# Patient Record
Sex: Female | Born: 1970 | Race: White | Hispanic: No | Marital: Married | Smoking: Never smoker
Health system: Southern US, Community
[De-identification: ages and names within clinical notes are randomized; demographics above are authoritative.]

## PROBLEM LIST (undated history)

## (undated) DIAGNOSIS — I10 Essential (primary) hypertension: Secondary | ICD-10-CM

## (undated) DIAGNOSIS — T7840XA Allergy, unspecified, initial encounter: Secondary | ICD-10-CM

## (undated) DIAGNOSIS — M797 Fibromyalgia: Secondary | ICD-10-CM

## (undated) DIAGNOSIS — F32A Depression, unspecified: Secondary | ICD-10-CM

## (undated) DIAGNOSIS — F419 Anxiety disorder, unspecified: Secondary | ICD-10-CM

## (undated) DIAGNOSIS — M1990 Unspecified osteoarthritis, unspecified site: Secondary | ICD-10-CM

## (undated) DIAGNOSIS — F329 Major depressive disorder, single episode, unspecified: Secondary | ICD-10-CM

## (undated) HISTORY — PX: JOINT REPLACEMENT: SHX530

## (undated) HISTORY — DX: Depression, unspecified: F32.A

## (undated) HISTORY — PX: KNEE SURGERY: SHX244

## (undated) HISTORY — DX: Unspecified osteoarthritis, unspecified site: M19.90

## (undated) HISTORY — DX: Allergy, unspecified, initial encounter: T78.40XA

## (undated) HISTORY — PX: TUBAL LIGATION: SHX77

## (undated) HISTORY — DX: Essential (primary) hypertension: I10

## (undated) HISTORY — DX: Major depressive disorder, single episode, unspecified: F32.9

## (undated) HISTORY — DX: Anxiety disorder, unspecified: F41.9

## (undated) HISTORY — DX: Fibromyalgia: M79.7

---

## 2004-11-14 HISTORY — PX: TUBAL LIGATION: SHX77

## 2013-12-11 ENCOUNTER — Encounter: Payer: Self-pay | Admitting: Adult Health

## 2013-12-11 ENCOUNTER — Ambulatory Visit (INDEPENDENT_AMBULATORY_CARE_PROVIDER_SITE_OTHER): Payer: BC Managed Care – PPO | Admitting: Adult Health

## 2013-12-11 ENCOUNTER — Encounter (INDEPENDENT_AMBULATORY_CARE_PROVIDER_SITE_OTHER): Payer: Self-pay

## 2013-12-11 VITALS — BP 122/76 | HR 58 | Temp 98.0°F | Resp 12 | Ht 66.5 in | Wt 221.0 lb

## 2013-12-11 DIAGNOSIS — M797 Fibromyalgia: Secondary | ICD-10-CM | POA: Insufficient documentation

## 2013-12-11 DIAGNOSIS — R5383 Other fatigue: Secondary | ICD-10-CM | POA: Insufficient documentation

## 2013-12-11 DIAGNOSIS — IMO0001 Reserved for inherently not codable concepts without codable children: Secondary | ICD-10-CM

## 2013-12-11 DIAGNOSIS — R5381 Other malaise: Secondary | ICD-10-CM

## 2013-12-11 DIAGNOSIS — Z79899 Other long term (current) drug therapy: Secondary | ICD-10-CM

## 2013-12-11 DIAGNOSIS — G2581 Restless legs syndrome: Secondary | ICD-10-CM

## 2013-12-11 MED ORDER — PREGABALIN 75 MG PO CAPS: 75.0000 mg | ORAL_CAPSULE | Freq: Two times a day (BID) | ORAL | Status: DC

## 2013-12-11 NOTE — Assessment & Plan Note (Signed)
Check labs: TSH, cbc, iron, ferritin

## 2013-12-11 NOTE — Assessment & Plan Note (Signed)
Check B12, iron, ferritin.

## 2013-12-11 NOTE — Progress Notes (Signed)
Subjective:    Patient ID: Patty Kennedy, female    DOB: 1971/04/17, [age] y.o.   MRN: [id number]  HPI  Patty Kennedy is a pleasant 43 y/o female who presents to establish care. She was previously followed by Dr. Cyndia [facility]iverMcFadden in [facility]. She has seen him within the last year and reports having blood work. Will request records. She reports history of fibromyalgia diagnosed in 2005. She was on multiple medications but felt like "a zombie". She gradually stopped taking all of the medications. She reports having a flareup in December of her symptoms. She has been taking 800 mg of ibuprofen on a daily basis just to be able to sleep. She has also been having symptoms of restless legs. She reports having a history of low iron. Previously on replacement. She is feeling fatigue and not her usual self.    Past Medical History  Diagnosis Date  . Arthritis   . Depression   . Fibromyalgia      Past Surgical History  Procedure Laterality Date  . Tubal ligation  2006  . Knee surgery Bilateral 1999, 2000    arthroscopy     Family History  Problem Relation Age of Onset  . Arthritis Mother     rheumatoid  . Depression Mother   . Cancer Father [id number]    bone cancer  . Arthritis Paternal Grandmother   . Hypertension Sister   . Alcohol abuse Sister      History   Social History  . Marital Status: Married    Spouse Name: Onalee HuaDavid    Number of Children: 3  . Years of Education: 12   Occupational History  . Operations Manager     [employer]   Social History Main Topics  . Smoking status: Never Smoker   . Smokeless tobacco: Never Used  . Alcohol Use: Yes     Comment: Rare 1 every 6 month  . Drug Use: No  . Sexual Activity: Not on file   Other Topics Concern  . Not on file   Social History Narrative   Patty [place] She lives at home with her husband, Onalee HuaDavid, in [place]. 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 She enjoys people. Patty Kennedy enjoys gardening and the outdoors.  She also enjoys swimming. She also enjoys reading.    Review of Systems  Constitutional: Positive for fatigue.  HENT: Negative.   Eyes: Negative.   Respiratory: Negative.   Cardiovascular: Negative.   Gastrointestinal: Negative.   Endocrine: Negative.   Genitourinary: Negative.   Musculoskeletal: Positive for arthralgias and myalgias.       Fibromyalgia flare up since December  Skin: Negative.   Allergic/Immunologic: Negative.   Neurological: Negative.   Hematological: Negative.   Psychiatric/Behavioral: Negative.        Objective:   Physical Exam  Constitutional: She is oriented to person, place, and time. No distress.  Very pleasant 43 y/o female  HENT:  Head: Normocephalic and atraumatic.  Eyes: Conjunctivae and EOM are normal. Pupils are equal, round, and reactive to light.  Neck: Normal range of motion. Neck supple. No tracheal deviation present.  Cardiovascular: Normal rate, regular rhythm, normal heart sounds and intact distal pulses.  Exam reveals no gallop and no friction rub.   No murmur heard. Pulmonary/Chest: Effort normal and breath sounds normal. No respiratory distress. She has no wheezes. She has no rales.  Musculoskeletal: Normal range of motion. She exhibits no edema and no tenderness.  Neurological: She  is alert and oriented to person, place, and time. She has normal reflexes. Coordination normal.  Skin: Skin is warm and dry.  Psychiatric: She has a normal mood and affect. Her behavior is normal. Judgment and thought content normal.          Assessment & Plan:

## 2013-12-11 NOTE — Progress Notes (Signed)
Pre visit review using our clinic review tool, if applicable. No additional management support is needed unless otherwise documented below in the visit note. 

## 2013-12-11 NOTE — Assessment & Plan Note (Signed)
Diagnosed in 2005. She had been doing fine until December when she reports having a flare up of her symptoms. Was on multiple medications but did not like to take so many meds. Now taking ibuprofen 800 mg daily. Check bmet, cbc. Start Lyrica 75 mg bid. Follow up in 1 month.

## 2013-12-11 NOTE — Patient Instructions (Signed)
   Thank you for choosing [facility] for your health care needs.  Please have your labs drawn prior to leaving the office.  I will call you with the results once they are available.  Start Lyrica 75 mg twice a day. Return for follow up visit in 1 month.l

## 2013-12-12 ENCOUNTER — Other Ambulatory Visit: Payer: Self-pay | Admitting: Adult Health

## 2013-12-12 DIAGNOSIS — E538 Deficiency of other specified B group vitamins: Secondary | ICD-10-CM

## 2013-12-12 LAB — BASIC METABOLIC PANEL
BUN: 16 mg/dL (ref 6–23)
CHLORIDE: 103 meq/L (ref 96–112)
CO2: 25 mEq/L (ref 19–32)
Calcium: 9.4 mg/dL (ref 8.4–10.5)
Creatinine, Ser: 0.8 mg/dL (ref 0.4–1.2)
GFR: 86.27 mL/min (ref >60.00)
GLUCOSE: 79 mg/dL (ref 70–99)
POTASSIUM: 4.4 meq/L (ref 3.5–5.1)
SODIUM: 138 meq/L (ref 135–145)

## 2013-12-12 LAB — TSH: TSH: 0.87 u[IU]/mL (ref 0.35–5.50)

## 2013-12-12 LAB — IRON: Iron: 60 ug/dL (ref 42–145)

## 2013-12-12 LAB — FERRITIN: Ferritin: 30.8 ng/mL (ref 10.0–291.0)

## 2013-12-12 LAB — VITAMIN B12: VITAMIN B 12: 295 pg/mL (ref 211–911)

## 2014-01-01 ENCOUNTER — Encounter: Payer: Self-pay | Admitting: Internal Medicine

## 2014-01-01 ENCOUNTER — Ambulatory Visit (INDEPENDENT_AMBULATORY_CARE_PROVIDER_SITE_OTHER): Payer: BC Managed Care – PPO | Admitting: Internal Medicine

## 2014-01-01 VITALS — BP 142/90 | HR 60 | Temp 99.0°F | Resp 18 | Wt 222.5 lb

## 2014-01-01 DIAGNOSIS — J029 Acute pharyngitis, unspecified: Secondary | ICD-10-CM

## 2014-01-01 DIAGNOSIS — R6889 Other general symptoms and signs: Secondary | ICD-10-CM

## 2014-01-01 LAB — POCT INFLUENZA A/B
Influenza A, POC: NEGATIVE
Influenza B, POC: NEGATIVE

## 2014-01-01 LAB — POCT RAPID STREP A (OFFICE): Rapid Strep A Screen: NEGATIVE

## 2014-01-01 MED ORDER — HYDROCOD POLST-CHLORPHEN POLST 10-8 MG/5ML PO LQCR: 10.0000 mL | Freq: Every evening | ORAL | Status: DC | PRN

## 2014-01-01 MED ORDER — PROMETHAZINE HCL 12.5 MG PO TABS: 12.5000 mg | ORAL_TABLET | Freq: Three times a day (TID) | ORAL | Status: DC | PRN

## 2014-01-01 MED ORDER — AMOXICILLIN-POT CLAVULANATE 875-125 MG PO TABS: 1.0000 | ORAL_TABLET | Freq: Two times a day (BID) | ORAL | Status: DC

## 2014-01-01 MED ORDER — ESCITALOPRAM OXALATE 20 MG PO TABS: 20.0000 mg | ORAL_TABLET | Freq: Every day | ORAL | Status: DC

## 2014-01-01 NOTE — Progress Notes (Signed)
Pre-visit discussion using our clinic review tool. No additional management support is needed unless otherwise documented below in the visit note.  

## 2014-01-01 NOTE — Patient Instructions (Signed)
You appear to be suffering from a viral  Syndrome . The reapid strep and flu tests were negative, and the throat culture is pending   The post nasal drip may be  causing your sore throat.  Lavage your sinuses twice daly with Simply saline nasal spray.  Use benadryl 25 mg every 8 hours and Sudafed PE 10 to 30 every 8 hours to manage the drainage and congestion.  Gargle with salt water often for the sore thr  You can use Delsym for daytime cough and  tussionex for the nighttime  cough.  If thethroat is no better  In 3 to 4 days OR  if you develop T > 100.4,  Green nasal discharge,  Or facial pain,  Start the augmentin  Please take a probiotic ( Align, Floraque or Culturelle) for 2 weeks if you start the antibiotic to prevent a serious antibiotic associated diarrhea  Called" clostridium dificile colitis" ( should also help prevent   vaginal yeast infection)

## 2014-01-01 NOTE — Progress Notes (Signed)
Patient ID: Patty Kennedy, female   DOB: November 17, 1970, 43 y.o.   MRN: [id number]  Patient Active Problem List   Diagnosis Date Noted  . Acute pharyngitis 01/02/2014  . Fibromyalgia 12/11/2013  . Restless legs 12/11/2013  . Fatigue 12/11/2013    Subjective:  CC:   Chief Complaint  Patient presents with  . Acute Visit  . Sore Throat    X 3days  . Chills    HPI:   Patty Kennedy is a 43 y.o. female who presents for  Saturday onset,  Throat felt like razor blades, on Saturday and Sunday .Monday felt terrible,  left work early.   sleeping a lot, but  no appetite,  Everything aching.   Back hips, shoulder, .  No known contacts but works at [employer] .  Didn't get the flu vaccine this year.  Left cervical LN .  coughin at night,  Not sleeping  More than an hour at a time.   Last May had a case of strep pharyngitis managed by prior [facility].    Past Medical History  Diagnosis Date  . Arthritis   . Depression   . Fibromyalgia     Past Surgical History  Procedure Laterality Date  . Tubal ligation  2006  . Knee surgery Bilateral 1999, 2000    arthroscopy       The following portions of the patient's history were reviewed and updated as appropriate: Allergies, current medications, and problem list.    Review of Systems:   Patient denies headache, fevers, malaise, unintentional weight loss, skin rash, eye pain, sinus congestion and sinus pain, sore throat, dysphagia,  hemoptysis , cough, dyspnea, wheezing, chest pain, palpitations, orthopnea, edema, abdominal pain, nausea, melena, diarrhea, constipation, flank pain, dysuria, hematuria, urinary  Frequency, nocturia, numbness, tingling, seizures,  Focal weakness, Loss of consciousness,  Tremor, insomnia, depression, anxiety, and suicidal ideation.     History   Social History  . Marital Status: Married    Spouse Name: Onalee HuaDavid    Number of Children: 3  . Years of Education: 12   Occupational History  . Operations  Manager     [employer]   Social History Main Topics  . Smoking status: Never Smoker   . Smokeless tobacco: Never Used  . Alcohol Use: Yes     Comment: Rare 1 every 6 month  . Drug Use: No  . Sexual Activity: Not on file   Other Topics Concern  . Not on file   Social History Narrative   [name] [place] She lives at home with her husband, Onalee HuaDavid, in [place]. 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 She enjoys people. [name] enjoys gardening and the outdoors. She also enjoys swimming. She also enjoys reading.    Objective:  Filed Vitals:   01/01/14 1528  BP: 142/90  Pulse: 60  Temp: 99 F (37.2 C)  Resp: 18     General appearance: alert, cooperative and appears stated age Ears: normal TM's and external ear canals both ears Throat: lips, mucosa, and tongue normal; teeth and gums normal Neck: no adenopathy, no carotid bruit, supple, symmetrical, trachea midline and thyroid not enlarged, symmetric, no tenderness/mass/nodules Back: symmetric, no curvature. ROM normal. No CVA tenderness. Lungs: clear to auscultation bilaterally Heart: regular rate and rhythm, S1, S2 normal, no murmur, click, rub or gallop Abdomen: soft, non-tender; bowel sounds normal; no masses,  no organomegaly Pulses: 2+ and symmetric Skin: Skin color, texture, turgor normal.  No rashes or lesions Lymph nodes: Cervical, supraclavicular, and axillary nodes normal.  Assessment and Plan:  Acute pharyngitis Symptoms of  URI are caused by viral infection currently given her current symptoms.   I have explained that in viral URIS, an antibiotic will not help the symptoms and will increase the risk of developing diarrhea. Advised to use oral and nasal decongestants,  Ibuprofen 400 mg and tylenol 650 mq 8 hrs for aches and pains,  tessalon every 8 hours prn cough  Advised to start round of abx only if symptoms worsen to include fevers, facial pain, purulent sputum./drainage.   Throat culture is  pending.    Updated Medication List Outpatient Encounter Prescriptions as of 01/01/2014  Medication Sig  . escitalopram (LEXAPRO) 20 MG tablet Take 1 tablet (20 mg total) by mouth daily.  Marland Kitchen ibuprofen (ADVIL,MOTRIN) 800 MG tablet Take 800 mg by mouth daily.   . Melatonin 5 MG TABS Take 5 mg by mouth daily.  . [DISCONTINUED] escitalopram (LEXAPRO) 20 MG tablet Take 20 mg by mouth daily.   Marland Kitchen amoxicillin-clavulanate (AUGMENTIN) 875-125 MG per tablet Take 1 tablet by mouth 2 (two) times daily.  . chlorpheniramine-HYDROcodone (TUSSIONEX) 10-8 MG/5ML LQCR Take 10 mLs by mouth at bedtime as needed for cough.  . pregabalin (LYRICA) 75 MG capsule Take 1 capsule (75 mg total) by mouth 2 (two) times daily.  . promethazine (PHENERGAN) 12.5 MG tablet Take 1 tablet (12.5 mg total) by mouth every 8 (eight) hours as needed for nausea or vomiting.     Orders Placed This Encounter  Procedures  . Throat culture [facility])  . POCT rapid strep A  . POCT Influenza A/B    No Follow-up on file.

## 2014-01-02 ENCOUNTER — Encounter: Payer: Self-pay | Admitting: Internal Medicine

## 2014-01-02 DIAGNOSIS — J029 Acute pharyngitis, unspecified: Secondary | ICD-10-CM | POA: Insufficient documentation

## 2014-01-02 NOTE — Assessment & Plan Note (Signed)
Symptoms of  URI are caused by viral infection currently given her current symptoms.   I have explained that in viral URIS, an antibiotic will not help the symptoms and will increase the risk of developing diarrhea. Advised to use oral and nasal decongestants,  Ibuprofen 400 mg and tylenol 650 mq 8 hrs for aches and pains,  tessalon every 8 hours prn cough  Advised to start round of abx only if symptoms worsen to include fevers, facial pain, purulent sputum./drainage.  

## 2014-01-03 LAB — CULTURE, GROUP A STREP: Organism ID, Bacteria: NORMAL

## 2014-01-05 ENCOUNTER — Encounter: Payer: Self-pay | Admitting: Internal Medicine

## 2014-01-06 ENCOUNTER — Telehealth: Payer: Self-pay | Admitting: Adult Health

## 2014-01-06 NOTE — Telephone Encounter (Signed)
Please advise 

## 2014-01-06 NOTE — Telephone Encounter (Signed)
Patient not any better from her last visit on 2.22.15. She has a sore throat , and her right ear is stopped up she can't hardly hear wanting an appointment today ,but non available. Please advise.

## 2014-01-07 ENCOUNTER — Telehealth: Payer: Self-pay | Admitting: Adult Health

## 2014-01-07 NOTE — Telephone Encounter (Signed)
Please call her first thing in the morning to be seen Wednesday

## 2014-01-07 NOTE — Telephone Encounter (Signed)
Message to pt.

## 2014-01-07 NOTE — Telephone Encounter (Signed)
Mailed unread MyChart message to pt  

## 2014-01-08 ENCOUNTER — Telehealth: Payer: Self-pay | Admitting: Adult Health

## 2014-01-08 NOTE — Telephone Encounter (Signed)
Patty Kennedy spoke with pt, had not started antibiotic prescribed by Dr. Darrick Huntsmanullo. Will start that and call back next week with persistent symptoms.

## 2014-01-08 NOTE — Telephone Encounter (Signed)
Spoke with patient this morning. Her symptoms have not improved since being seen on 01/01/14. She reports that her throat is better but now feeling congestion and coughing. She has not started the antibiotic that was prescribed. Instructed to go ahead and start. If no improvement by Monday she will call the office for appointment.

## 2014-02-27 ENCOUNTER — Encounter: Payer: Self-pay | Admitting: Adult Health

## 2014-02-28 ENCOUNTER — Other Ambulatory Visit: Payer: Self-pay | Admitting: Adult Health

## 2014-02-28 MED ORDER — VALACYCLOVIR HCL 1 G PO TABS: ORAL_TABLET | ORAL | Status: DC

## 2014-02-28 NOTE — Telephone Encounter (Signed)
Request for Rx for cold sores/ blisters, Please advise

## 2015-03-20 DIAGNOSIS — I10 Essential (primary) hypertension: Secondary | ICD-10-CM | POA: Insufficient documentation

## 2015-03-20 DIAGNOSIS — F32A Depression, unspecified: Secondary | ICD-10-CM | POA: Insufficient documentation

## 2015-11-18 DIAGNOSIS — B009 Herpesviral infection, unspecified: Secondary | ICD-10-CM | POA: Insufficient documentation

## 2016-01-18 ENCOUNTER — Other Ambulatory Visit: Payer: Self-pay | Admitting: Family Medicine

## 2016-01-18 DIAGNOSIS — Z1231 Encounter for screening mammogram for malignant neoplasm of breast: Secondary | ICD-10-CM

## 2016-01-19 ENCOUNTER — Ambulatory Visit
Admission: RE | Admit: 2016-01-19 | Discharge: 2016-01-19 | Disposition: A | Discharge status: Home / Self Care | Payer: 59 | Source: Ambulatory Visit | Attending: Family Medicine | Admitting: Family Medicine

## 2016-01-19 DIAGNOSIS — Z1231 Encounter for screening mammogram for malignant neoplasm of breast: Secondary | ICD-10-CM | POA: Insufficient documentation

## 2016-05-21 ENCOUNTER — Encounter: Payer: Self-pay | Admitting: Emergency Medicine

## 2016-05-21 ENCOUNTER — Emergency Department: Payer: Commercial Managed Care - HMO

## 2016-05-21 ENCOUNTER — Emergency Department
Admission: EM | Admit: 2016-05-21 | Discharge: 2016-05-21 | Disposition: A | Discharge status: Home / Self Care | Payer: Commercial Managed Care - HMO | Attending: Emergency Medicine | Admitting: Emergency Medicine

## 2016-05-21 DIAGNOSIS — N390 Urinary tract infection, site not specified: Secondary | ICD-10-CM | POA: Diagnosis not present

## 2016-05-21 DIAGNOSIS — R1032 Left lower quadrant pain: Secondary | ICD-10-CM | POA: Diagnosis present

## 2016-05-21 DIAGNOSIS — M1990 Unspecified osteoarthritis, unspecified site: Secondary | ICD-10-CM | POA: Diagnosis not present

## 2016-05-21 DIAGNOSIS — N83202 Unspecified ovarian cyst, left side: Secondary | ICD-10-CM | POA: Insufficient documentation

## 2016-05-21 DIAGNOSIS — R1030 Lower abdominal pain, unspecified: Secondary | ICD-10-CM

## 2016-05-21 DIAGNOSIS — F329 Major depressive disorder, single episode, unspecified: Secondary | ICD-10-CM | POA: Insufficient documentation

## 2016-05-21 LAB — URINALYSIS COMPLETE WITH MICROSCOPIC (ARMC ONLY)
BACTERIA UA: NONE SEEN
Bilirubin Urine: NEGATIVE
Glucose, UA: NEGATIVE mg/dL
NITRITE: NEGATIVE
PH: 5 (ref 5.0–8.0)
PROTEIN: NEGATIVE mg/dL
SPECIFIC GRAVITY, URINE: 1.021 (ref 1.005–1.030)

## 2016-05-21 LAB — CBC
HEMATOCRIT: 39 % (ref 35.0–47.0)
HEMOGLOBIN: 13.5 g/dL (ref 12.0–16.0)
MCH: 31.1 pg (ref 26.0–34.0)
MCHC: 34.5 g/dL (ref 32.0–36.0)
MCV: 90.2 fL (ref 80.0–100.0)
Platelets: 230 10*3/uL (ref 150–440)
RBC: 4.33 MIL/uL (ref 3.80–5.20)
RDW: 12.8 % (ref 11.5–14.5)
WBC: 8.8 10*3/uL (ref 3.6–11.0)

## 2016-05-21 LAB — LIPASE, BLOOD: Lipase: 22 U/L (ref 11–51)

## 2016-05-21 LAB — COMPREHENSIVE METABOLIC PANEL
ALBUMIN: 4.2 g/dL (ref 3.5–5.0)
ALT: 11 U/L — ABNORMAL LOW (ref 14–54)
ANION GAP: 7 (ref 5–15)
AST: 15 U/L (ref 15–41)
Alkaline Phosphatase: 51 U/L (ref 38–126)
BILIRUBIN TOTAL: 0.4 mg/dL (ref 0.3–1.2)
BUN: 15 mg/dL (ref 6–20)
CO2: 27 mmol/L (ref 22–32)
Calcium: 9.3 mg/dL (ref 8.9–10.3)
Chloride: 104 mmol/L (ref 101–111)
Creatinine, Ser: 0.88 mg/dL (ref 0.44–1.00)
GFR calc non Af Amer: >60 mL/min (ref >60)
GLUCOSE: 91 mg/dL (ref 65–99)
POTASSIUM: 4.1 mmol/L (ref 3.5–5.1)
SODIUM: 138 mmol/L (ref 135–145)
TOTAL PROTEIN: 7.5 g/dL (ref 6.5–8.1)

## 2016-05-21 MED ORDER — SULFAMETHOXAZOLE-TRIMETHOPRIM 800-160 MG PO TABS: 1.0000 | ORAL_TABLET | Freq: Two times a day (BID) | ORAL | Status: DC

## 2016-05-21 MED ORDER — OXYCODONE-ACETAMINOPHEN 5-325 MG PO TABS
2.0000 | ORAL_TABLET | Freq: Once | ORAL | Status: AC
Start: 2016-05-21 — End: 2016-05-21
  Administered 2016-05-21: 2 via ORAL
  Filled 2016-05-21: qty 2

## 2016-05-21 MED ORDER — FENTANYL CITRATE (PF) 100 MCG/2ML IJ SOLN
50.0000 ug | INTRAMUSCULAR | Status: AC | PRN
  Administered 2016-05-21 (×2): 50 ug via INTRAVENOUS
  Filled 2016-05-21: qty 2

## 2016-05-21 MED ORDER — IBUPROFEN 800 MG PO TABS: 800.0000 mg | ORAL_TABLET | Freq: Three times a day (TID) | ORAL | Status: DC | PRN

## 2016-05-21 MED ORDER — POLYETHYLENE GLYCOL 3350 17 G PO PACK: 17.0000 g | PACK | Freq: Every day | ORAL | Status: DC

## 2016-05-21 NOTE — ED Provider Notes (Signed)
[facility] Emergency Department Provider Note        Time seen: ----------------------------------------- 8:45 PM on 05/21/2016 -----------------------------------------    I have reviewed the triage vital signs and the nursing notes.   HISTORY  Chief Complaint Abdominal Pain    HPI Patty Kennedy is a 45 y.o. female who presents to ER with acute onset of lower abdominal pain that began 1 hour prior to arrival. She states the pain is more on the left side now, sharp. Nothing makes it better or worse. She has not had nausea, vomiting or dysuria. She's not had diarrhea. Patient denies history of same, states she has chronic constipation   History reviewed. No pertinent past medical history.  There are no active problems to display for this patient.   Past Surgical History  Procedure Laterality Date  . Tubal ligation      Allergies Review of patient's allergies indicates no known allergies.  Social History Social History  Substance Use Topics  . Smoking status: Never Smoker   . Smokeless tobacco: None  . Alcohol Use: No    Review of Systems Constitutional: Negative for fever. Cardiovascular: Negative for chest pain. Respiratory: Negative for shortness of breath. Gastrointestinal: Positive for abdominal pain Genitourinary: Negative for dysuria. Musculoskeletal: Negative for back pain. Skin: Negative for rash. Neurological: Negative for headaches, focal weakness or numbness.  10-point ROS otherwise negative.  ____________________________________________   PHYSICAL EXAM:  VITAL SIGNS: ED Triage Vitals  Enc Vitals Group     BP 05/21/16 1719 152/88 mmHg     Pulse Rate 05/21/16 1719 79     Resp 05/21/16 1719 20     Temp 05/21/16 1719 98.6 F (37 C)     Temp Source 05/21/16 1719 Oral     SpO2 05/21/16 1719 99 %     Weight 05/21/16 1719 225 lb (102.059 kg)     Height 05/21/16 1719 5\' 7"  (1.702 m)     Head Cir --      Peak Flow --       Pain Score 05/21/16 1720 8     Pain Loc --      Pain Edu? --      Excl. in GC? --    Constitutional: Alert and oriented. Well appearing and in no distress. Eyes: Conjunctivae are normal. PERRL. Normal extraocular movements. ENT   Head: Normocephalic and atraumatic.   Nose: No congestion/rhinnorhea.   Mouth/Throat: Mucous membranes are moist.   Neck: No stridor. Cardiovascular: Normal rate, regular rhythm. No murmurs, rubs, or gallops. Respiratory: Normal respiratory effort without tachypnea nor retractions. Breath sounds are clear and equal bilaterally. No wheezes/rales/rhonchi. Gastrointestinal: Mild left flank tenderness, no rebound or guarding. Normal bowel sounds. Musculoskeletal: Nontender with normal range of motion in all extremities. No lower extremity tenderness nor edema. Neurologic:  Normal speech and language. No gross focal neurologic deficits are appreciated.  Skin:  Skin is warm, dry and intact. No rash noted. Psychiatric: Mood and affect are normal. Speech and behavior are normal.  ___________________________________________  ED COURSE:  Pertinent labs & imaging results that were available during my care of the patient were reviewed by me and considered in my medical decision making (see chart for details). Patient is in no acute distress, we will assess for renal colic, given oral pain medicine and reevaluate. ____________________________________________    LABS (pertinent positives/negatives)  Labs Reviewed  COMPREHENSIVE METABOLIC PANEL - Abnormal; Notable for the following:    ALT 11 (*)  All other components within normal limits  URINALYSIS COMPLETEWITH MICROSCOPIC ([facility] ONLY) - Abnormal; Notable for the following:    Color, Urine YELLOW (*)    APPearance HAZY (*)    Ketones, ur TRACE (*)    Hgb urine dipstick 1+ (*)    Leukocytes, UA 2+ (*)    Squamous Epithelial / LPF 6-30 (*)    All other components within normal limits  LIPASE,  BLOOD  CBC    RADIOLOGY Images were viewed by me  CT renal protocol  IMPRESSION: No renal, ureteral, or bladder calculi. No hydronephrosis.  No evidence of bowel obstruction. Normal appendix.  3.2 cm left ovarian cyst/follicle, likely physiologic. If clinically warranted, follow-up pelvic ultrasound can be performed in 6-10 weeks.  Four small left lower lobe pulmonary nodules measuring up to 4 mm, likely benign. No follow-up needed if patient is low-risk (and has no known or suspected primary neoplasm). Non-contrast chest CT can be considered in 12 months if patient is high-risk. This recommendation follows the consensus statement: Guidelines for Management of Incidental Pulmonary Nodules Detected on CT Images: From the Fleischner Society 2017; published online before print (10.1148/radiol.[id number]).  ____________________________________________  FINAL ASSESSMENT AND PLAN  Abdominal pain  Plan: Patient with labs and imaging as dictated above. Patient is in no acute distress, pain is likely from left ovarian cyst. Patient has a benign exam, doubtful for ovarian torsion. She'll be discharged with mild pain medication and close follow-up with her doctor.   Emily Filbert, MD   Note: This dictation was prepared with Dragon dictation. Any transcriptional errors that result from this process are unintentional   Emily Filbert, MD 05/21/16 2113

## 2016-05-21 NOTE — Discharge Instructions (Signed)
Abdominal Pain, Adult Many things can cause abdominal pain. Usually, abdominal pain is not caused by a disease and will improve without treatment. It can often be observed and treated at home. Your health care provider will do a physical exam and possibly order blood tests and X-rays to help determine the seriousness of your pain. However, in many cases, more time must pass before a clear cause of the pain can be found. Before that point, your health care provider may not know if you need more testing or further treatment. HOME CARE INSTRUCTIONS Monitor your abdominal pain for any changes. The following actions may help to alleviate any discomfort you are experiencing:  Only take over-the-counter or prescription medicines as directed by your health care provider.  Do not take laxatives unless directed to do so by your health care provider.  Try a clear liquid diet (broth, tea, or water) as directed by your health care provider. Slowly move to a bland diet as tolerated. SEEK MEDICAL CARE IF:  You have unexplained abdominal pain.  You have abdominal pain associated with nausea or diarrhea.  You have pain when you urinate or have a bowel movement.  You experience abdominal pain that wakes you in the night.  You have abdominal pain that is worsened or improved by eating food.  You have abdominal pain that is worsened with eating fatty foods.  You have a fever. SEEK IMMEDIATE MEDICAL CARE IF:  Your pain does not go away within 2 hours.  You keep throwing up (vomiting).  Your pain is felt only in portions of the abdomen, such as the right side or the left lower portion of the abdomen.  You pass bloody or black tarry stools. MAKE SURE YOU:  Understand these instructions.  Will watch your condition.  Will get help right away if you are not doing well or get worse.   This information is not intended to replace advice given to you by your health care provider. Make sure you discuss  any questions you have with your health care provider.   Document Released: 08/10/2005 Document Revised: 07/22/2015 Document Reviewed: 07/10/2013 Elsevier Interactive Patient Education 2016 [employer]  Urinary Tract Infection Urinary tract infections (UTIs) can develop anywhere along your urinary tract. Your urinary tract is your body's drainage system for removing wastes and extra water. Your urinary tract includes two kidneys, two ureters, a bladder, and a urethra. Your kidneys are a pair of bean-shaped organs. Each kidney is about the size of your fist. They are located below your ribs, one on each side of your spine. CAUSES Infections are caused by microbes, which are microscopic organisms, including fungi, viruses, and bacteria. These organisms are so small that they can only be seen through a microscope. Bacteria are the microbes that most commonly cause UTIs. SYMPTOMS  Symptoms of UTIs may vary by age and gender of the patient and by the location of the infection. Symptoms in young women typically include a frequent and intense urge to urinate and a painful, burning feeling in the bladder or urethra during urination. Older women and men are more likely to be tired, shaky, and weak and have muscle aches and abdominal pain. A fever may mean the infection is in your kidneys. Other symptoms of a kidney infection include pain in your back or sides below the ribs, nausea, and vomiting. DIAGNOSIS To diagnose a UTI, your caregiver will ask you about your symptoms. Your caregiver will also ask you to provide a urine sample.  The urine sample will be tested for bacteria and white blood cells. White blood cells are made by your body to help fight infection. TREATMENT  Typically, UTIs can be treated with medication. Because most UTIs are caused by a bacterial infection, they usually can be treated with the use of antibiotics. The choice of antibiotic and length of treatment depend on your symptoms and  the type of bacteria causing your infection. HOME CARE INSTRUCTIONS  If you were prescribed antibiotics, take them exactly as your caregiver instructs you. Finish the medication even if you feel better after you have only taken some of the medication.  Drink enough water and fluids to keep your urine clear or pale yellow.  Avoid caffeine, tea, and carbonated beverages. They tend to irritate your bladder.  Empty your bladder often. Avoid holding urine for long periods of time.  Empty your bladder before and after sexual intercourse.  After a bowel movement, women should cleanse from front to back. Use each tissue only once. SEEK MEDICAL CARE IF:   You have back pain.  You develop a fever.  Your symptoms do not begin to resolve within 3 days. SEEK IMMEDIATE MEDICAL CARE IF:   You have severe back pain or lower abdominal pain.  You develop chills.  You have nausea or vomiting.  You have continued burning or discomfort with urination. MAKE SURE YOU:   Understand these instructions.  Will watch your condition.  Will get help right away if you are not doing well or get worse.   This information is not intended to replace advice given to you by your health care provider. Make sure you discuss any questions you have with your health care provider.   Document Released: 08/10/2005 Document Revised: 07/22/2015 Document Reviewed: 12/09/2011 Elsevier Interactive Patient Education 2016 [employer] Inc[employer] Ovarian Cyst An ovarian cyst is a fluid-filled sac that forms on an ovary. The ovaries are small organs that produce eggs in women. Various types of cysts can form on the ovaries. Most are not cancerous. Many do not cause problems, and they often go away on their own. Some may cause symptoms and require treatment. Common types of ovarian cysts include:  Functional cysts--These cysts may occur every month during the menstrual cycle. This is normal. The cysts usually go away with the next  menstrual cycle if the woman does not get pregnant. Usually, there are no symptoms with a functional cyst.  Endometrioma cysts--These cysts form from the tissue that lines the uterus. They are also called "chocolate cysts" because they become filled with blood that turns brown. This type of cyst can cause pain in the lower abdomen during intercourse and with your menstrual period.  Cystadenoma cysts--This type develops from the cells on the outside of the ovary. These cysts can get very big and cause lower abdomen pain and pain with intercourse. This type of cyst can twist on itself, cut off its blood supply, and cause severe pain. It can also easily rupture and cause a lot of pain.  Dermoid cysts--This type of cyst is sometimes found in both ovaries. These cysts may contain different kinds of body tissue, such as skin, teeth, hair, or cartilage. They usually do not cause symptoms unless they get very big.  Theca lutein cysts--These cysts occur when too much of a certain hormone (human chorionic gonadotropin) is produced and overstimulates the ovaries to produce an egg. This is most common after procedures used to assist with the conception of a baby (in vitro  fertilization). CAUSES   Fertility drugs can cause a condition in which multiple large cysts are formed on the ovaries. This is called ovarian hyperstimulation syndrome.  A condition called polycystic ovary syndrome can cause hormonal imbalances that can lead to nonfunctional ovarian cysts. SIGNS AND SYMPTOMS  Many ovarian cysts do not cause symptoms. If symptoms are present, they may include:  Pelvic pain or pressure.  Pain in the lower abdomen.  Pain during sexual intercourse.  Increasing girth (swelling) of the abdomen.  Abnormal menstrual periods.  Increasing pain with menstrual periods.  Stopping having menstrual periods without being pregnant. DIAGNOSIS  These cysts are commonly found during a routine or annual pelvic exam.  Tests may be ordered to find out more about the cyst. These tests may include:  Ultrasound.  X-ray of the pelvis.  CT scan.  MRI.  Blood tests. TREATMENT  Many ovarian cysts go away on their own without treatment. Your health care provider may want to check your cyst regularly for 2-3 months to see if it changes. For women in menopause, it is particularly important to monitor a cyst closely because of the higher rate of ovarian cancer in menopausal women. When treatment is needed, it may include any of the following:  A procedure to drain the cyst (aspiration). This may be done using a long needle and ultrasound. It can also be done through a laparoscopic procedure. This involves using a thin, lighted tube with a tiny camera on the end (laparoscope) inserted through a small incision.  Surgery to remove the whole cyst. This may be done using laparoscopic surgery or an open surgery involving a larger incision in the lower abdomen.  Hormone treatment or birth control pills. These methods are sometimes used to help dissolve a cyst. HOME CARE INSTRUCTIONS   Only take over-the-counter or prescription medicines as directed by your health care provider.  Follow up with your health care provider as directed.  Get regular pelvic exams and Pap tests. SEEK MEDICAL CARE IF:   Your periods are late, irregular, or painful, or they stop.  Your pelvic pain or abdominal pain does not go away.  Your abdomen becomes larger or swollen.  You have pressure on your bladder or trouble emptying your bladder completely.  You have pain during sexual intercourse.  You have feelings of fullness, pressure, or discomfort in your stomach.  You lose weight for no apparent reason.  You feel generally ill.  You become constipated.  You lose your appetite.  You develop acne.  You have an increase in body and facial hair.  You are gaining weight, without changing your exercise and eating habits.  You  think you are pregnant. SEEK IMMEDIATE MEDICAL CARE IF:   You have increasing abdominal pain.  You feel sick to your stomach (nauseous), and you throw up (vomit).  You develop a fever that comes on suddenly.  You have abdominal pain during a bowel movement.  Your menstrual periods become heavier than usual. MAKE SURE YOU:  Understand these instructions.  Will watch your condition.  Will get help right away if you are not doing well or get worse.   This information is not intended to replace advice given to you by your health care provider. Make sure you discuss any questions you have with your health care provider.   Document Released: 10/31/2005 Document Revised: 11/05/2013 Document Reviewed: 07/08/2013 Elsevier Interactive Patient Education [employer] Inc[employer]

## 2016-05-21 NOTE — ED Notes (Signed)
Acute onset lower abdominal pain approx 1 hour ago. Denies nausea, vomiting or dysuria. No diarrhea.

## 2016-05-23 ENCOUNTER — Encounter: Payer: Self-pay | Admitting: Internal Medicine

## 2017-03-01 ENCOUNTER — Other Ambulatory Visit: Payer: Self-pay | Admitting: Family Medicine

## 2017-03-01 DIAGNOSIS — Z1231 Encounter for screening mammogram for malignant neoplasm of breast: Secondary | ICD-10-CM

## 2017-03-24 ENCOUNTER — Ambulatory Visit
Admission: RE | Admit: 2017-03-24 | Discharge: 2017-03-24 | Disposition: A | Discharge status: Home / Self Care | Payer: 59 | Source: Ambulatory Visit | Attending: Family Medicine | Admitting: Family Medicine

## 2017-03-24 DIAGNOSIS — Z1231 Encounter for screening mammogram for malignant neoplasm of breast: Secondary | ICD-10-CM | POA: Diagnosis not present

## 2017-04-19 DIAGNOSIS — G4700 Insomnia, unspecified: Secondary | ICD-10-CM | POA: Insufficient documentation

## 2018-03-21 ENCOUNTER — Ambulatory Visit
Payer: 59 | Attending: Student in an Organized Health Care Education/Training Program | Admitting: Student in an Organized Health Care Education/Training Program

## 2018-03-21 ENCOUNTER — Other Ambulatory Visit: Payer: Self-pay

## 2018-03-21 ENCOUNTER — Encounter: Payer: Self-pay | Admitting: Student in an Organized Health Care Education/Training Program

## 2018-03-21 VITALS — BP 154/96 | HR 66 | Temp 98.8°F | Resp 18 | Ht 65.0 in | Wt 194.0 lb

## 2018-03-21 DIAGNOSIS — M179 Osteoarthritis of knee, unspecified: Secondary | ICD-10-CM | POA: Insufficient documentation

## 2018-03-21 DIAGNOSIS — G8929 Other chronic pain: Secondary | ICD-10-CM | POA: Insufficient documentation

## 2018-03-21 DIAGNOSIS — M25561 Pain in right knee: Secondary | ICD-10-CM

## 2018-03-21 DIAGNOSIS — E669 Obesity, unspecified: Secondary | ICD-10-CM | POA: Diagnosis not present

## 2018-03-21 DIAGNOSIS — Z6832 Body mass index (BMI) 32.0-32.9, adult: Secondary | ICD-10-CM | POA: Diagnosis not present

## 2018-03-21 DIAGNOSIS — G894 Chronic pain syndrome: Secondary | ICD-10-CM | POA: Diagnosis not present

## 2018-03-21 DIAGNOSIS — M1711 Unilateral primary osteoarthritis, right knee: Secondary | ICD-10-CM | POA: Insufficient documentation

## 2018-03-21 NOTE — Patient Instructions (Addendum)
___Procedure: Genicular Nerve Block with Sedation _________________________________________________________________________________________  Preparing for Procedure with Sedation  Instructions: . Oral Intake: Do not eat or drink anything for at least 8 hours prior to your procedure. . Transportation: Public transportation is not allowed. Bring an adult driver. The driver must be physically present in our waiting room before any procedure can be started. [facility] Kitchen Physical Assistance: Bring an adult physically capable of assisting you, in the event you need help. This adult should keep you company at home for at least 6 hours after the procedure. . Blood Pressure Medicine: Take your blood pressure medicine with a sip of water the morning of the procedure. . Blood thinners:  . Diabetics on insulin: Notify the staff so that you can be scheduled 1st case in the morning. If your diabetes requires high dose insulin, take only  of your normal insulin dose the morning of the procedure and notify the staff that you have done so. . Preventing infections: Shower with an antibacterial soap the morning of your procedure. . Build-up your immune system: Take 1000 mg of Vitamin C with every meal (3 times a day) the day prior to your procedure. Marland Kitchen Antibiotics: Inform the staff if you have a condition or reason that requires you to take antibiotics before dental procedures. . Pregnancy: If you are pregnant, call and cancel the procedure. . Sickness: If you have a cold, fever, or any active infections, call and cancel the procedure. . Arrival: You must be in the facility at least 30 minutes prior to your scheduled procedure. . Children: Do not bring children with you. . Dress appropriately: Bring dark clothing that you would not mind if they get stained. . Valuables: Do not bring any jewelry or valuables.  Procedure appointments are reserved for interventional treatments only. Marland Kitchen No Prescription Refills. . No medication  changes will be discussed during procedure appointments. . No disability issues will be discussed.  Remember:  Regular Business hours are:  Monday to Thursday 8:00 AM to 4:00 PM  Provider's Schedule: Delano Metz, MD:  Procedure days: Tuesday and Thursday 7:30 AM to 4:00 PM  Edward Jolly, MD:  Procedure days: Monday and Wednesday 7:30 AM to 4:00 PM ____________________________________________________________________________________________  Appointment Policy Summary  It is our goal and responsibility to provide the medical community with assistance in the evaluation and management of patients with chronic pain. Unfortunately our resources are limited. Because we do not have an unlimited amount of time, or available appointments, we are required to closely monitor and manage their use. The following rules exist to maximize their use:  Patient's responsibilities: 1. Punctuality:  At what time should I arrive? You should be physically present in our office 30 minutes before your scheduled appointment. Your scheduled appointment is with your assigned healthcare provider. However, it takes 5-10 minutes to be "checked-in", and another 15 minutes for the nurses to do the admission. If you arrive to our office at the time you were given for your appointment, you will end up being at least 20-25 minutes late to your appointment with the provider. 2. Tardiness:  What happens if I arrive only a few minutes after my scheduled appointment time? You will need to reschedule your appointment. The cutoff is your appointment time. This is why it is so important that you arrive at least 30 minutes before that appointment. If you have an appointment scheduled for 10:00 AM and you arrive at 10:01, you will be required to reschedule your appointment.  3. Plan ahead:  Always assume that you will encounter traffic on your way in. Plan for it. If you are dependent on a driver, make sure they understand these  rules and the need to arrive early. 4. Other appointments and responsibilities:  Avoid scheduling any other appointments before or after your pain clinic appointments.  5. Be prepared:  Write down everything that you need to discuss with your healthcare provider and give this information to the admitting nurse. Write down the medications that you will need refilled. Bring your pills and bottles (even the empty ones), to all of your appointments, except for those where a procedure is scheduled. 6. No children or pets:  Find someone to take care of them. It is not appropriate to bring them in. 7. Scheduling changes:  We request "advanced notification" of any changes or cancellations. 8. Advanced notification:  Defined as a time period of more than 24 hours prior to the originally scheduled appointment. This allows for the appointment to be offered to other patients. 9. Rescheduling:  When a visit is rescheduled, it will require the cancellation of the original appointment. For this reason they both fall within the category of "Cancellations".  10. Cancellations:  They require advanced notification. Any cancellation less than 24 hours before the  appointment will be recorded as a "No Show". 11. No Show:  Defined as an unkept appointment where the patient failed to notify or declare to the practice their intention or inability to keep the appointment.  Corrective process for repeat offenders:  1. Tardiness: Three (3) episodes of rescheduling due to late arrivals will be recorded as one (1) "No Show". 2. Cancellation or reschedule: Three (3) cancellations or rescheduling will be recorded as one (1) "No Show". 3. "No Shows": Three (3) "No Shows" within a 12 month period will result in discharge from the  practice. ____________________________________________________________________________________________  ____________________________________________________________________________________________

## 2018-03-21 NOTE — Progress Notes (Signed)
Patient's Name: Patty Kennedy  MRN: [id number]  Referring Provider: Diamond Nickel, MD  DOB: November 14, 1971  PCP: Patty Jericho, NP  DOS: 03/21/2018  Note by: Patty Santa, MD  Service setting: Ambulatory outpatient  Specialty: Interventional Pain Management  Location: [facility] (AMB) Pain Management Facility    Patient type: New patient ("FAST-TRACK" Evaluation)   Warning: This referral option does not include the extensive pharmacological evaluation required for [place] to take over the patient's medication management. The "Fast-Track" system is designed to bypass the new patient referral waiting list, as well as the normal patient evaluation process, in order to provide a patient in distress with a timely pain management intervention. Because the system was not designed to unfairly get a patient into our pain practice ahead of those already waiting, certain restrictions apply. By requesting a "Fast-Track" consult, the referring physician has opted to continue managing the patient's medications in order to get interventional urgent care.  Primary Reason for Visit: Interventional Pain Management Treatment. CC: Knee Pain (right)   HPI  Patty Kennedy is a 47 y.o. year old, female patient, who comes today for a  "Fast-Track" new patient evaluation, as requested by Patty Nickel, MD. The patient has been made aware that this type of referral option is reserved for the Interventional Pain Management portion of our practice and completely excludes the option of medication management. Her primarily concern today is the Knee Pain (right)  Pain Assessment: Location: Right Knee Radiating: radiates to back of knee Onset: More than a month ago Duration: Chronic pain Quality: Stabbing, Aching, Sharp Severity: 5 /10 (subjective, self-reported pain score)  Note: Reported level is compatible with observation.                         When using our objective Pain Scale, levels between 6 and 10/10 are said to  belong in an emergency room, as it progressively worsens from a 6/10, described as severely limiting, requiring emergency care not usually available at an outpatient pain management facility. At a 6/10 level, communication becomes difficult and requires great effort. Assistance to reach the emergency department may be required. Facial flushing and profuse sweating along with potentially dangerous increases in heart rate and blood pressure will be evident. Effect on ADL:   Timing: Constant(when walking) Modifying factors: denies BP: (!) 154/96  HR: 66  Onset and Duration: Present longer than 3 months Cause of pain: none noted Severity: No change since onset, NAS-11 at its worse: 9/10, NAS-11 at its best: 6/10, NAS-11 now: 6/10 and NAS-11 on the average: 6/10 Timing: During activity or exercise and After a period of immobility Aggravating Factors: Bending, Climbing, Kneeling, Prolonged sitting, Squatting and Walking uphill Alleviating Factors: none noted Associated Problems: Pain that wakes patient up and Pain that does not allow patient to sleep Quality of Pain: Aching, Sharp and Stabbing Previous Examinations or Tests: X-rays and Orthopedic evaluation Previous Treatments: Steroid treatments by mouth  The patient comes into the clinics today, referred to [place] for a right knee genicular nerve block for right knee osteoarthritis.  Of note patient has undergone intra-articular steroid injections in her right knee which have been moderately effective for a short period of time.  She is endorsing worsening knee pain that radiates into her posterior knee.  She states that she is unable to perform hobbies or go to the gym given that the pain is very intense.  Patient has minimal pain when she is sedentary/stationary,  with an increase in her knee pain when she is upright and weightbearing.  Of note patient has lost 15 pounds since December.  She likes to exercise and is somewhat frustrated that she has been  unable to given her persistent right knee pain.  Meds   Current Outpatient Medications:  .  amitriptyline (ELAVIL) 25 MG tablet, Take by mouth., Disp: , Rfl:  .  fexofenadine (ALLEGRA) 180 MG tablet, Take by mouth., Disp: , Rfl:  .  hydrochlorothiazide (HYDRODIURIL) 12.5 MG tablet, Take by mouth., Disp: , Rfl:  .  meloxicam (MOBIC) 15 MG tablet, Take 15 mg by mouth daily., Disp: , Rfl:  .  polyethylene glycol (MIRALAX / GLYCOLAX) packet, Take 17 g by mouth daily., Disp: 14 each, Rfl: 0 .  valACYclovir (VALTREX) 1000 MG tablet, Take 2000 mg every 12 hours for 1 day, Disp: 4 tablet, Rfl: 3 .  amoxicillin-clavulanate (AUGMENTIN) 875-125 MG per tablet, Take 1 tablet by mouth 2 (two) times daily. (Patient not taking: Reported on 03/21/2018), Disp: 14 tablet, Rfl: 0 .  chlorpheniramine-HYDROcodone (TUSSIONEX) 10-8 MG/5ML LQCR, Take 10 mLs by mouth at bedtime as needed for cough. (Patient not taking: Reported on 03/21/2018), Disp: 180 mL, Rfl: 0 .  escitalopram (LEXAPRO) 20 MG tablet, Take 1 tablet (20 mg total) by mouth daily. (Patient not taking: Reported on 03/21/2018), Disp: 90 tablet, Rfl: 3 .  ibuprofen (ADVIL,MOTRIN) 800 MG tablet, Take 800 mg by mouth daily. , Disp: , Rfl:  .  ibuprofen (ADVIL,MOTRIN) 800 MG tablet, Take 1 tablet (800 mg total) by mouth every 8 (eight) hours as needed. (Patient not taking: Reported on 03/21/2018), Disp: 30 tablet, Rfl: 0 .  Melatonin 5 MG TABS, Take 5 mg by mouth daily., Disp: , Rfl:  .  pregabalin (LYRICA) 75 MG capsule, Take 1 capsule (75 mg total) by mouth 2 (two) times daily. (Patient not taking: Reported on 03/21/2018), Disp: 60 capsule, Rfl: 3 .  promethazine (PHENERGAN) 12.5 MG tablet, Take 1 tablet (12.5 mg total) by mouth every 8 (eight) hours as needed for nausea or vomiting. (Patient not taking: Reported on 03/21/2018), Disp: 30 tablet, Rfl: 1 .  sulfamethoxazole-trimethoprim (BACTRIM DS) 800-160 MG tablet, Take 1 tablet by mouth 2 (two) times daily. (Patient not  taking: Reported on 03/21/2018), Disp: 6 tablet, Rfl: 0  Imaging Review   Left and right knee radiographs, 3 views and left knee radiographs, 3 views.   Clinical information: right knee pain, M25.561 Pain in right knee. Chronic left knee pain.  Comparison: None   Findings: No definite joint effusions are identified. The right knee demonstrates tricompartmental osteophytosis with moderate narrowing of the lateral aspect patellofemoral compartment. There is no evidence of lytic or blastic lesion. No fractures or subluxations are identified. The left knee shows mild osteophytosis without definite joint space narrowing. There are no lytic or blastic lesions. No fractures or subluxations. A possible small loose body is seen at the posterior aspect of the joint.  Impression: Tricompartmental osteophytosis in both knees greater on the right than the left. Moderate right patellofemoral osteoarthrosis. Possible loose body at the posterior aspect of the left knee.  Complexity Note: Imaging results reviewed. Results shared with Ms. Bellino, using Layman's terms.                         ROS  Cardiovascular: No reported cardiovascular signs or symptoms such as High blood pressure, coronary artery disease, abnormal heart rate or rhythm, heart attack,  blood thinner therapy or heart weakness and/or failure Pulmonary or Respiratory: No reported pulmonary signs or symptoms such as wheezing and difficulty taking a deep full breath (Asthma), difficulty blowing air out (Emphysema), coughing up mucus (Bronchitis), persistent dry cough, or temporary stoppage of breathing during sleep Neurological: No reported neurological signs or symptoms such as seizures, abnormal skin sensations, urinary and/or fecal incontinence, being born with an abnormal open spine and/or a tethered spinal cord Review of Past Neurological Studies: No results found for this or any previous visit. Psychological-Psychiatric: No reported  psychological or psychiatric signs or symptoms such as difficulty sleeping, anxiety, depression, delusions or hallucinations (schizophrenial), mood swings (bipolar disorders) or suicidal ideations or attempts Gastrointestinal: No reported gastrointestinal signs or symptoms such as vomiting or evacuating blood, reflux, heartburn, alternating episodes of diarrhea and constipation, inflamed or scarred liver, or pancreas or irrregular and/or infrequent bowel movements Genitourinary: No reported renal or genitourinary signs or symptoms such as difficulty voiding or producing urine, peeing blood, non-functioning kidney, kidney stones, difficulty emptying the bladder, difficulty controlling the flow of urine, or chronic kidney disease Hematological: No reported hematological signs or symptoms such as prolonged bleeding, low or poor functioning platelets, bruising or bleeding easily, hereditary bleeding problems, low energy levels due to low hemoglobin or being anemic Endocrine: No reported endocrine signs or symptoms such as high or low blood sugar, rapid heart rate due to high thyroid levels, obesity or weight gain due to slow thyroid or thyroid disease Rheumatologic: Joint aches and or swelling due to excess weight (Osteoarthritis) and Generalized muscle aches (Fibromyalgia) Musculoskeletal: Negative for myasthenia gravis, muscular dystrophy, multiple sclerosis or malignant hyperthermia Work History: Working full time  Allergies  Ms. Armas has No Known Allergies.  Laboratory Chemistry  Inflammation Markers (CRP: Acute Phase) (ESR: Chronic Phase) No results found for: CRP, ESRSEDRATE, LATICACIDVEN                       Rheumatology Markers No results found for: RF, ANA, Therisa [place], [facility]                      Renal Function Markers Lab Results  Component Value Date   BUN 15 05/21/2016   CREATININE 0.88 05/21/2016   GFRAA >60 05/21/2016   GFRNONAA >60 05/21/2016                               Hepatic Function Markers Lab Results  Component Value Date   AST 15 05/21/2016   ALT 11 (L) 05/21/2016   ALBUMIN 4.2 05/21/2016   ALKPHOS 51 05/21/2016   LIPASE 22 05/21/2016                        Electrolytes Lab Results  Component Value Date   NA 138 05/21/2016   K 4.1 05/21/2016   CL 104 05/21/2016   CALCIUM 9.3 05/21/2016                        Neuropathy Markers Lab Results  Component Value Date   VITAMINB12 295 12/11/2013                        Bone Pathology Markers No results found for: [place], [id number], [id number], [id number], [place], 25OHVITD2, 25OHVITD3, TESTOFREE, TESTOSTERONE  Coagulation Parameters Lab Results  Component Value Date   PLT 230 05/21/2016                        Cardiovascular Markers Lab Results  Component Value Date   HGB 13.5 05/21/2016   HCT 39.0 05/21/2016                         CA Markers No results found for: CEA, CA125, LABCA2                      Note: Lab results reviewed.  [place]  Drug: Ms. Kontz  reports that she does not use drugs. Alcohol:  reports that she does not drink alcohol. Tobacco:  reports that she has never smoked. She has never used smokeless tobacco. Medical:  has a past medical history of Arthritis, Depression, and Fibromyalgia. Family: family history includes Alcohol abuse in her sister; Arthritis in her mother and paternal grandmother; Cancer (age of onset: 42) in her father; Depression in her mother; Hypertension in her sister.  Past Surgical History:  Procedure Laterality Date  . KNEE SURGERY Bilateral 1999, 2000   arthroscopy  . TUBAL LIGATION  2006  . TUBAL LIGATION     Active Ambulatory Problems    Diagnosis Date Noted  . Fibromyalgia 12/11/2013  . Restless legs 12/11/2013  . Fatigue 12/11/2013  . Acute pharyngitis 01/02/2014  . Primary osteoarthritis of right knee 03/21/2018  . Chronic pain of right knee 03/21/2018  . Class 1 obesity without  serious comorbidity with body mass index (BMI) of 32.0 to 32.9 in adult 03/21/2018  . Chronic pain syndrome 03/21/2018   Resolved Ambulatory Problems    Diagnosis Date Noted  . No Resolved Ambulatory Problems   Past Medical History:  Diagnosis Date  . Arthritis   . Depression   . Fibromyalgia    Constitutional Exam  General appearance: Well nourished, well developed, and well hydrated. In no apparent acute distress Vitals:   03/21/18 1217  BP: (!) 154/96  Pulse: 66  Resp: 18  Temp: 98.8 F (37.1 C)  SpO2: 100%  Weight: 194 lb (88 kg)  Height: 5' 5"  (1.651 m)   BMI Assessment: Estimated body mass index is 32.28 kg/m as calculated from the following:   Height as of this encounter: 5' 5"  (1.651 m).   Weight as of this encounter: 194 lb (88 kg).  BMI interpretation table: BMI level Category Range association with higher incidence of chronic pain  <18 kg/m2 Underweight   18.5-24.9 kg/m2 Ideal body weight   25-29.9 kg/m2 Overweight Increased incidence by 20%  30-34.9 kg/m2 Obese (Class I) Increased incidence by 68%  35-39.9 kg/m2 Severe obesity (Class II) Increased incidence by 136%  >40 kg/m2 Extreme obesity (Class III) Increased incidence by 254%   Patient's current BMI Ideal Body weight  Body mass index is 32.28 kg/m. Ideal body weight: 57 kg (125 lb 10.6 oz) Adjusted ideal body weight: 69.4 kg (153 lb)   BMI Readings from Last 4 Encounters:  03/21/18 32.28 kg/m  05/21/16 35.24 kg/m  01/01/14 35.37 kg/m  12/11/13 35.14 kg/m   Wt Readings from Last 4 Encounters:  03/21/18 194 lb (88 kg)  05/21/16 225 lb (102.1 kg)  01/01/14 222 lb 8 oz (100.9 kg)  12/11/13 221 lb (100.2 kg)  Psych/Mental status: Alert, oriented x 3 (person, place, & time)       Eyes:  PERLA Respiratory: No evidence of acute respiratory distress Cervical Spine Area Exam  Skin & Axial Inspection: No masses, redness, edema, swelling, or associated skin lesions Alignment:  Symmetrical Functional ROM: Unrestricted ROM      Stability: No instability detected Muscle Tone/Strength: Functionally intact. No obvious neuro-muscular anomalies detected. Sensory (Neurological): Unimpaired Palpation: No palpable anomalies              Upper Extremity (UE) Exam    Side: Right upper extremity  Side: Left upper extremity  Skin & Extremity Inspection: Skin color, temperature, and hair growth are WNL. No peripheral edema or cyanosis. No masses, redness, swelling, asymmetry, or associated skin lesions. No contractures.  Skin & Extremity Inspection: Skin color, temperature, and hair growth are WNL. No peripheral edema or cyanosis. No masses, redness, swelling, asymmetry, or associated skin lesions. No contractures.  Functional ROM: Unrestricted ROM          Functional ROM: Unrestricted ROM          Muscle Tone/Strength: Functionally intact. No obvious neuro-muscular anomalies detected.  Muscle Tone/Strength: Functionally intact. No obvious neuro-muscular anomalies detected.  Sensory (Neurological): Unimpaired          Sensory (Neurological): Unimpaired          Palpation: No palpable anomalies              Palpation: No palpable anomalies              Specialized Test(s): Deferred         Specialized Test(s): Deferred           Lumbar Spine Area Exam  Skin & Axial Inspection: No masses, redness, or swelling Alignment: Symmetrical Functional ROM: Unrestricted ROM       Stability: No instability detected Muscle Tone/Strength: Functionally intact. No obvious neuro-muscular anomalies detected. Sensory (Neurological): Unimpaired Palpation: No palpable anomalies       Provocative Tests: Lumbar Hyperextension and rotation test: evaluation deferred today       Lumbar Lateral bending test: evaluation deferred today       Patrick's Maneuver: evaluation deferred today                    Gait & Posture Assessment  Ambulation: Unassisted Gait: Relatively normal for age and body  habitus Posture: WNL   Lower Extremity Exam    Side: Right lower extremity  Side: Left lower extremity  Stability: No instability observed          Stability: No instability observed          Skin & Extremity Inspection: Skin color, temperature, and hair growth are WNL. No peripheral edema or cyanosis. No masses, redness, swelling, asymmetry, or associated skin lesions. No contractures.  Skin & Extremity Inspection: Skin color, temperature, and hair growth are WNL. No peripheral edema or cyanosis. No masses, redness, swelling, asymmetry, or associated skin lesions. No contractures.  Functional ROM: Decreased ROM for hip and knee joints          Functional ROM: Decreased ROM for knee joint          Muscle Tone/Strength: Functionally intact. No obvious neuro-muscular anomalies detected.  Muscle Tone/Strength: Functionally intact. No obvious neuro-muscular anomalies detected.  Sensory (Neurological): Arthropathic arthralgia  Sensory (Neurological): Arthropathic arthralgia  Palpation: Complains of area being tender to palpation  Palpation: Complains of area being tender to palpation    Assessment and plan:  1. Primary osteoarthritis of right knee   2. Chronic pain of right  knee   3. Class 1 obesity without serious comorbidity with body mass index (BMI) of 32.0 to 32.9 in adult, unspecified obesity type   4. Chronic pain syndrome    47 year old female with a history of chronic bilateral knee pain, right greater than left status post intra-articular knee steroid injections (with moderate, short term pain relief) who is referred for as fast track for right knee genicular nerve block for right knee pain.  Risks and benefits of the procedure were discussed.  Patient is not a diabetic.  We will get her scheduled for right knee genicular nerve block with sedation under fluoroscopy.  I offered to do the procedure today however the patient did not have a driver with her and prefer to do the procedure with  IV sedation.  We will get her scheduled ASAP.  Orders Placed This Encounter  Procedures  . GENICULAR NERVE BLOCK    Indication(s):  Sub-acute knee pain    Standing Status:   Future    Standing Expiration Date:   04/20/2018    Scheduling Instructions:     Side: Right-sided     Sedation: With Sedation.     Timeframe: ASAP    Order Specific Question:   Where will this procedure be performed?    Answer:   [facility]

## 2018-03-21 NOTE — Progress Notes (Signed)
Safety precautions to be maintained throughout the outpatient stay will include: orient to surroundings, keep bed in low position, maintain call bell within reach at all times, provide assistance with transfer out of bed and ambulation.  

## 2018-04-02 ENCOUNTER — Ambulatory Visit (HOSPITAL_BASED_OUTPATIENT_CLINIC_OR_DEPARTMENT_OTHER): Payer: 59 | Admitting: Student in an Organized Health Care Education/Training Program

## 2018-04-02 ENCOUNTER — Ambulatory Visit
Admission: RE | Admit: 2018-04-02 | Discharge: 2018-04-02 | Disposition: A | Discharge status: Home / Self Care | Payer: 59 | Source: Ambulatory Visit | Attending: Student in an Organized Health Care Education/Training Program | Admitting: Student in an Organized Health Care Education/Training Program

## 2018-04-02 ENCOUNTER — Encounter: Payer: Self-pay | Admitting: Student in an Organized Health Care Education/Training Program

## 2018-04-02 VITALS — BP 112/86 | HR 66 | Temp 98.5°F | Resp 12 | Ht 65.0 in | Wt 195.0 lb

## 2018-04-02 DIAGNOSIS — Z889 Allergy status to unspecified drugs, medicaments and biological substances status: Secondary | ICD-10-CM | POA: Diagnosis not present

## 2018-04-02 DIAGNOSIS — Z79891 Long term (current) use of opiate analgesic: Secondary | ICD-10-CM | POA: Insufficient documentation

## 2018-04-02 DIAGNOSIS — Z880 Allergy status to penicillin: Secondary | ICD-10-CM | POA: Insufficient documentation

## 2018-04-02 DIAGNOSIS — G8929 Other chronic pain: Secondary | ICD-10-CM

## 2018-04-02 DIAGNOSIS — Z791 Long term (current) use of non-steroidal anti-inflammatories (NSAID): Secondary | ICD-10-CM | POA: Insufficient documentation

## 2018-04-02 DIAGNOSIS — M1711 Unilateral primary osteoarthritis, right knee: Secondary | ICD-10-CM | POA: Diagnosis not present

## 2018-04-02 DIAGNOSIS — Z79899 Other long term (current) drug therapy: Secondary | ICD-10-CM | POA: Insufficient documentation

## 2018-04-02 DIAGNOSIS — M25561 Pain in right knee: Secondary | ICD-10-CM

## 2018-04-02 DIAGNOSIS — Z9851 Tubal ligation status: Secondary | ICD-10-CM | POA: Insufficient documentation

## 2018-04-02 MED ORDER — FENTANYL CITRATE (PF) 100 MCG/2ML IJ SOLN
25.0000 ug | INTRAMUSCULAR | Status: DC | PRN
  Administered 2018-04-02: 50 ug via INTRAVENOUS
  Filled 2018-04-02: qty 2

## 2018-04-02 MED ORDER — DEXAMETHASONE SODIUM PHOSPHATE 10 MG/ML IJ SOLN
10.0000 mg | Freq: Once | INTRAMUSCULAR | Status: AC
  Administered 2018-04-02: 10 mg
  Filled 2018-04-02: qty 1

## 2018-04-02 MED ORDER — ROPIVACAINE HCL 2 MG/ML IJ SOLN
10.0000 mL | Freq: Once | INTRAMUSCULAR | Status: AC
  Administered 2018-04-02: 10 mL
  Filled 2018-04-02: qty 10

## 2018-04-02 MED ORDER — LIDOCAINE HCL 1 % IJ SOLN
10.0000 mL | Freq: Once | INTRAMUSCULAR | Status: AC
  Administered 2018-04-02: 5 mL
  Filled 2018-04-02: qty 10

## 2018-04-02 MED ORDER — LACTATED RINGERS IV SOLN
1000.0000 mL | Freq: Once | INTRAVENOUS | Status: AC
  Administered 2018-04-02: 1000 mL via INTRAVENOUS

## 2018-04-02 NOTE — Patient Instructions (Signed)

## 2018-04-02 NOTE — Progress Notes (Signed)
Safety precautions to be maintained throughout the outpatient stay will include: orient to surroundings, keep bed in low position, maintain call bell within reach at all times, provide assistance with transfer out of bed and ambulation.  

## 2018-04-02 NOTE — Progress Notes (Signed)
Patient's Name: Patty Kennedy  MRN: [id number]  Referring Provider: Titus Mould*  DOB: 24-Feb-1971  PCP: Titus Mould, NP  DOS: 04/02/2018  Note by: Edward Jolly, MD  Service setting: Ambulatory outpatient  Specialty: Interventional Pain Management  Patient type: Established  Location: [facility] (AMB) Pain Management Facility  Visit type: Interventional Procedure   Primary Reason for Visit: Interventional Pain Management Treatment. CC: Knee Pain (right)  Procedure:  Anesthesia, Analgesia, Anxiolysis:  Type: Genicular Nerves Block (Superior-lateral, Superior-medial, and Inferior-medial Genicular Nerves)          CPT: 64450      Primary Purpose: Diagnostic Region: Lateral, Anterior, and Medial aspects of the knee joint, above and below the knee joint proper. Level: Superior and inferior to the knee joint. Target Area: For Genicular Nerve block(s), the targets are: the superior-lateral genicular nerve, located in the lateral distal portion of the femoral shaft as it curves to form the lateral epicondyle, in the region of the distal femoral metaphysis; the superior-medial genicular nerve, located in the medial distal portion of the femoral shaft as it curves to form the medial epicondyle; and the inferior-medial genicular nerve, located in the medial, proximal portion of the tibial shaft, as it curves to form the medial epicondyle, in the region of the proximal tibial metaphysis. Approach: Anterior, percutaneous, ipsilateral approach. Laterality: Right knee Position: Modified Fowler's position with pillows under the targeted knee(s).  Type: Moderate (Conscious) Sedation combined with Local Anesthesia Indication(s): Analgesia and Anxiety Route: Intravenous (IV) IV Access: Secured Sedation: Meaningful verbal contact was maintained at all times during the procedure  Local Anesthetic: Lidocaine 1%   Indications: 1. Primary osteoarthritis of right knee   2. Chronic pain of right knee     Pain Score: Pre-procedure: 4 /10 Post-procedure: 0-No pain/10  Pre-op Assessment:  Patty Kennedy is a 47 y.o. (year old), female patient, seen today for interventional treatment. She  has a past surgical history that includes Tubal ligation (2006); Knee surgery (Bilateral, 1999, 2000); and Tubal ligation. [name] has a current medication list which includes the following prescription(s): amitriptyline, hydrochlorothiazide, meloxicam, amoxicillin-clavulanate, chlorpheniramine-hydrocodone, escitalopram, fexofenadine, ibuprofen, ibuprofen, melatonin, polyethylene glycol, pregabalin, promethazine, sulfamethoxazole-trimethoprim, and valacyclovir, and the following Facility-Administered Medications: fentanyl. Her primarily concern today is the Knee Pain (right)  Initial Vital Signs:  Pulse/HCG Rate: 66ECG Heart Rate: (!) 54 Temp: 98.5 F (36.9 C) Resp: 16 BP: 122/82 SpO2: 100 %  BMI: Estimated body mass index is 32.45 kg/m as calculated from the following:   Height as of this encounter:  (1.651 m).   Weight as of this encounter: 195 lb (88.5 kg).  Risk Assessment: Allergies: Reviewed. She has No Known Allergies.  Allergy Precautions: None required Coagulopathies: Reviewed. None identified.  Blood-thinner therapy: None at this time Active Infection(s): Reviewed. None identified. Patty Kennedy is afebrile  Site Confirmation: [name] was asked to confirm the procedure and laterality before marking the site Procedure checklist: Completed Consent: Before the procedure and under the influence of no sedative(s), amnesic(s), or anxiolytics, the patient was informed of the treatment options, risks and possible complications. To fulfill our ethical and legal obligations, as recommended by the American Medical Association's Code of Ethics, I have informed the patient of my clinical impression; the nature and purpose of the treatment or procedure; the risks, benefits, and possible complications  of the intervention; the alternatives, including doing nothing; the risk(s) and benefit(s) of the alternative treatment(s) or procedure(s); and the risk(s) and benefit(s) of doing nothing. The  patient was provided information about the general risks and possible complications associated with the procedure. These may include, but are not limited to: failure to achieve desired goals, infection, bleeding, organ or nerve damage, allergic reactions, paralysis, and death. In addition, the patient was informed of those risks and complications associated to the procedure, such as failure to decrease pain; infection; bleeding; organ or nerve damage with subsequent damage to sensory, motor, and/or autonomic systems, resulting in permanent pain, numbness, and/or weakness of one or several areas of the body; allergic reactions; (i.e.: anaphylactic reaction); and/or death. Furthermore, the patient was informed of those risks and complications associated with the medications. These include, but are not limited to: allergic reactions (i.e.: anaphylactic or anaphylactoid reaction(s)); adrenal axis suppression; blood sugar elevation that in diabetics may result in ketoacidosis or comma; water retention that in patients with history of congestive heart failure may result in shortness of breath, pulmonary edema, and decompensation with resultant heart failure; weight gain; swelling or edema; medication-induced neural toxicity; particulate matter embolism and blood vessel occlusion with resultant organ, and/or nervous system infarction; and/or aseptic necrosis of one or more joints. Finally, the patient was informed that Medicine is not an exact science; therefore, there is also the possibility of unforeseen or unpredictable risks and/or possible complications that may result in a catastrophic outcome. The patient indicated having understood very clearly. We have given the patient no guarantees and we have made no promises. Enough  time was given to the patient to ask questions, all of which were answered to the patient's satisfaction. [name] has indicated that she wanted to continue with the procedure. Attestation: I, the ordering provider, attest that I have discussed with the patient the benefits, risks, side-effects, alternatives, likelihood of achieving goals, and potential problems during recovery for the procedure that I have provided informed consent. Date  Time: 04/02/2018  9:03 AM  Pre-Procedure Preparation:  Monitoring: As per clinic protocol. Respiration, ETCO2, SpO2, BP, heart rate and rhythm monitor placed and checked for adequate function Safety Precautions: Patient was assessed for positional comfort and pressure points before starting the procedure. Time-out: I initiated and conducted the "Time-out" before starting the procedure, as per protocol. The patient was asked to participate by confirming the accuracy of the "Time Out" information. Verification of the correct person, site, and procedure were performed and confirmed by me, the nursing staff, and the patient. "Time-out" conducted as per Joint Commission's Universal Protocol (UP.01.01.01). Time: 0941  Description of Procedure Process:  Area Prepped: Entire knee area, from mid-thigh to mid-shin, lateral, anterior, and medial aspects. Prepping solution: ChloraPrep (2% chlorhexidine gluconate and 70% isopropyl alcohol) Safety Precautions: Aspiration looking for blood return was conducted prior to all injections. At no point did we inject any substances, as a needle was being advanced. No attempts were made at seeking any paresthesias. Safe injection practices and needle disposal techniques used. Medications properly checked for expiration dates. SDV (single dose vial) medications used. Latex Allergy precautions taken.   Description of the Procedure: Protocol guidelines were followed. The patient was placed in position over the procedure table. The target  area was identified and the area prepped in the usual manner. Skin desensitized using vapocoolant spray. Skin & deeper tissues infiltrated with local anesthetic. Appropriate amount of time allowed to pass for local anesthetics to take effect. The procedure needles were then advanced to the target area. Proper needle placement secured. Negative aspiration confirmed. Solution injected in intermittent fashion, asking for systemic symptoms every 0.5cc of  injectate. The needles were then removed and the area cleansed, making sure to leave some of the prepping solution back to take advantage of its long term bactericidal properties.  Vitals:   04/02/18 0950 04/02/18 0956 04/02/18 1005 04/02/18 1014  BP: 113/71 120/73 116/80 112/86  Pulse:      Resp: Temp:      TempSrc:      SpO2: 96% 98% 100% 99%  Weight:      Height:        Start Time: 0941 hrs. End Time: 0949 hrs. Materials:  Needle(s) Type: Regular needle Gauge: 22G Length: 3.5-in Medication(s): Please see orders for medications and dosing details. 5 cc solution made of 4 cc of 0.2% ropivacaine, 1 cc of Decadron 10 mg/cc.  1.5 cc injected at each level above.  Imaging Guidance (Non-Spinal):  Type of Imaging Technique: Fluoroscopy Guidance (Non-Spinal) Indication(s): Assistance in needle guidance and placement for procedures requiring needle placement in or near specific anatomical locations not easily accessible without such assistance. Exposure Time: Please see nurses notes. Contrast: Before injecting any contrast, we confirmed that the patient did not have an allergy to iodine, shellfish, or radiological contrast. Once satisfactory needle placement was completed at the desired level, radiological contrast was injected. Contrast injected under live fluoroscopy. No contrast complications. See chart for type and volume of contrast used. Fluoroscopic Guidance: I was personally present during the use of fluoroscopy. "Tunnel Vision  Technique" used to obtain the best possible view of the target area. Parallax error corrected before commencing the procedure. "Direction-depth-direction" technique used to introduce the needle under continuous pulsed fluoroscopy. Once target was reached, antero-posterior, oblique, and lateral fluoroscopic projection used confirm needle placement in all planes. Images permanently stored in EMR. Interpretation: I personally interpreted the imaging intraoperatively. Adequate needle placement confirmed in multiple planes. Appropriate spread of contrast into desired area was observed. No evidence of afferent or efferent intravascular uptake. Permanent images saved into the patient's record.  Antibiotic Prophylaxis:   Anti-infectives (From admission, onward)   None     Indication(s): None identified  Post-operative Assessment:  Post-procedure Vital Signs:  Pulse/HCG Rate: 66(!) 53 Temp: 98.5 F (36.9 C) Resp: 12 BP: 112/86 SpO2: 99 %  EBL: None  Complications: No immediate post-treatment complications observed by team, or reported by patient.  Note: The patient tolerated the entire procedure well. A repeat set of vitals were taken after the procedure and the patient was kept under observation following institutional policy, for this type of procedure. Post-procedural neurological assessment was performed, showing return to baseline, prior to discharge. The patient was provided with post-procedure discharge instructions, including a section on how to identify potential problems. Should any problems arise concerning this procedure, the patient was given instructions to immediately contact us, at any time, without hesitation. In any case, we plan to contact the patient by telephone for a follow-up status report regarding this interventional procedure.  Comments:  No additional relevant information. 5 out of 5 strength bilateral lower extremity: Plantar flexion, dorsiflexion, knee flexion, knee  extension.  Plan of Care   Imaging Orders     DG C-Arm 1-60 Min-No Report Procedure Orders    No procedure(s) ordered today    Medications ordered for procedure: Meds ordered this encounter  Medications  . lactated ringers infusion 1,000 mL  . fentaNYL (SUBLIMAZE) injection 25-100 mcg    Make sure Narcan is available in the pyxis when using this medication. In the event of respiratory  depression (RR< 8/min): Titrate NARCAN (naloxone) in increments of 0.1 to 0.2 mg IV at 2-3 minute intervals, until desired degree of reversal.  . lidocaine (XYLOCAINE) 1 % (with pres) injection 10 mL  . ropivacaine (PF) 2 mg/mL (0.2%) (NAROPIN) injection 10 mL  . dexamethasone (DECADRON) injection 10 mg   Medications administered: We administered lactated ringers, fentaNYL, lidocaine, ropivacaine (PF) 2 mg/mL (0.2%), and dexamethasone.  See the medical record for exact dosing, route, and time of administration.  New Prescriptions   No medications on file   Disposition: Discharge home  Discharge Date & Time: 04/02/2018; 1015 hrs.   Physician-requested Follow-up: Return in about 3 weeks (around 04/23/2018) for Post Procedure Evaluation.  Future Appointments  Date Time Provider Department Center  05/03/2018 12:00 PM Edward Jolly, MD [facility] None   Primary Care Physician: Titus Mould, NP Location: [facility] Note by: Edward Jolly, MD Date: 04/02/2018; Time: 1:41 PM  Disclaimer:  Medicine is not an exact science. The only guarantee in medicine is that nothing is guaranteed. It is important to note that the decision to proceed with this intervention was based on the information collected from the patient. The Data and conclusions were drawn from the patient's questionnaire, the interview, and the physical examination. Because the information was provided in large part by the patient, it cannot be guaranteed that it has not been purposely or unconsciously  manipulated. Every effort has been made to obtain as much relevant data as possible for this evaluation. It is important to note that the conclusions that lead to this procedure are derived in large part from the available data. Always take into account that the treatment will also be dependent on availability of resources and existing treatment guidelines, considered by other Pain Management Practitioners as being common knowledge and practice, at the time of the intervention. For Medico-Legal purposes, it is also important to point out that variation in procedural techniques and pharmacological choices are the acceptable norm. The indications, contraindications, technique, and results of the above procedure should only be interpreted and judged by a Board-Certified Interventional Pain Specialist with extensive familiarity and expertise in the same exact procedure and technique.

## 2018-04-19 ENCOUNTER — Telehealth: Payer: Self-pay | Admitting: Student in an Organized Health Care Education/Training Program

## 2018-04-19 NOTE — Telephone Encounter (Signed)
Patient advised that sometimes there can be additional pain or soreness following a RF. She will wait until after vacation to have it.

## 2018-04-19 NOTE — Telephone Encounter (Signed)
Patient is scheduled for June 20 follow up to facet block and she goes on vacation week of July 4th. She needs advise about having RF done before or after vaca. Will she be in a lot of pain after RF or no. If no she would like to have done before she leaves for vacation. Please call patient to discuss and ask Dr. Cherylann RatelLateef what he thinks

## 2018-05-03 ENCOUNTER — Encounter: Payer: Self-pay | Admitting: Student in an Organized Health Care Education/Training Program

## 2018-05-03 ENCOUNTER — Ambulatory Visit
Payer: 59 | Attending: Student in an Organized Health Care Education/Training Program | Admitting: Student in an Organized Health Care Education/Training Program

## 2018-05-03 ENCOUNTER — Other Ambulatory Visit: Payer: Self-pay

## 2018-05-03 VITALS — BP 142/96 | HR 75 | Temp 99.1°F | Resp 18 | Ht 65.0 in | Wt 195.0 lb

## 2018-05-03 DIAGNOSIS — F329 Major depressive disorder, single episode, unspecified: Secondary | ICD-10-CM | POA: Insufficient documentation

## 2018-05-03 DIAGNOSIS — G2581 Restless legs syndrome: Secondary | ICD-10-CM | POA: Insufficient documentation

## 2018-05-03 DIAGNOSIS — Z79899 Other long term (current) drug therapy: Secondary | ICD-10-CM | POA: Diagnosis not present

## 2018-05-03 DIAGNOSIS — G8929 Other chronic pain: Secondary | ICD-10-CM

## 2018-05-03 DIAGNOSIS — Z791 Long term (current) use of non-steroidal anti-inflammatories (NSAID): Secondary | ICD-10-CM | POA: Diagnosis not present

## 2018-05-03 DIAGNOSIS — M1711 Unilateral primary osteoarthritis, right knee: Secondary | ICD-10-CM

## 2018-05-03 DIAGNOSIS — M797 Fibromyalgia: Secondary | ICD-10-CM | POA: Diagnosis not present

## 2018-05-03 DIAGNOSIS — E669 Obesity, unspecified: Secondary | ICD-10-CM

## 2018-05-03 DIAGNOSIS — M25561 Pain in right knee: Secondary | ICD-10-CM | POA: Diagnosis not present

## 2018-05-03 DIAGNOSIS — G894 Chronic pain syndrome: Secondary | ICD-10-CM | POA: Insufficient documentation

## 2018-05-03 DIAGNOSIS — Z8249 Family history of ischemic heart disease and other diseases of the circulatory system: Secondary | ICD-10-CM | POA: Diagnosis not present

## 2018-05-03 DIAGNOSIS — Z6832 Body mass index (BMI) 32.0-32.9, adult: Secondary | ICD-10-CM | POA: Diagnosis not present

## 2018-05-03 NOTE — Progress Notes (Signed)
Patient's Name: Patty Kennedy  MRN: [id number]  Referring Provider: Ricardo Jericho*  DOB: 1971/02/26  PCP: Ricardo Jericho, NP  DOS: 05/03/2018  Note by: Gillis Santa, MD  Service setting: Ambulatory outpatient  Specialty: Interventional Pain Management  Location: [facility] (AMB) Pain Management Facility    Patient type: Established   Primary Reason(s) for Visit: Encounter for post-procedure evaluation of chronic illness with mild to moderate exacerbation CC: Knee Pain (right)  HPI  Patty Kennedy is a 47 y.o. year old, female patient, who comes today for a post-procedure evaluation. She has Fibromyalgia; Restless legs; Fatigue; Acute pharyngitis; Primary osteoarthritis of right knee; Chronic pain of right knee; Class 1 obesity without serious comorbidity with body mass index (BMI) of 32.0 to 32.9 in adult; and Chronic pain syndrome on their problem list. Her primarily concern today is the Knee Pain (right)  Pain Assessment: Location: Right Knee Radiating: back of right lower leg Onset: More than a month ago Duration: Chronic pain Quality: Aching, Stabbing Severity: 8 /10 (subjective, self-reported pain score)  Note: Reported level is inconsistent with clinical observations.                         When using our objective Pain Scale, levels between 6 and 10/10 are said to belong in an emergency room, as it progressively worsens from a 6/10, described as severely limiting, requiring emergency care not usually available at an outpatient pain management facility. At a 6/10 level, communication becomes difficult and requires great effort. Assistance to reach the emergency department may be required. Facial flushing and profuse sweating along with potentially dangerous increases in heart rate and blood pressure will be evident. Effect on ADL:   Timing: Constant Modifying factors: nothing BP: (!) 142/96  HR: 75  Patty Kennedy comes in today for post-procedure evaluation after the treatment  done on 04/19/2018.  Further details on both, my assessment(s), as well as the proposed treatment plan, please see below.  Post-Procedure Assessment  04/19/2018 Procedure: Right knee genicular nerve block Pre-procedure pain score:  4/10 Post-procedure pain score: 0/10         Influential Factors: BMI: 32.45 kg/m Intra-procedural challenges: None observed.         Assessment challenges: None detected.              Reported side-effects: None.        Post-procedural adverse reactions or complications: None reported         Sedation: Please see nurses note. When no sedatives are used, the analgesic levels obtained are directly associated to the effectiveness of the local anesthetics. However, when sedation is provided, the level of analgesia obtained during the initial 1 hour following the intervention, is believed to be the result of a combination of factors. These factors may include, but are not limited to: 1. The effectiveness of the local anesthetics used. 2. The effects of the analgesic(s) and/or anxiolytic(s) used. 3. The degree of discomfort experienced by the patient at the time of the procedure. 4. The patients ability and reliability in recalling and recording the events. 5. The presence and influence of possible secondary gains and/or psychosocial factors. Reported result: Relief experienced during the 1st hour after the procedure: 100 % (Ultra-Short Term Relief)            Interpretative annotation: Clinically appropriate result. Analgesia during this period is likely to be Local Anesthetic and/or IV Sedative (Analgesic/Anxiolytic) related.  Effects of local anesthetic: The analgesic effects attained during this period are directly associated to the localized infiltration of local anesthetics and therefore cary significant diagnostic value as to the etiological location, or anatomical origin, of the pain. Expected duration of relief is directly dependent on the pharmacodynamics  of the local anesthetic used. Long-acting (4-6 hours) anesthetics used.  Reported result: Relief during the next 4 to 6 hour after the procedure: 100 % (Short-Term Relief)            Interpretative annotation: Clinically appropriate result. Analgesia during this period is likely to be Local Anesthetic-related.          Long-term benefit: Defined as the period of time past the expected duration of local anesthetics (1 hour for short-acting and 4-6 hours for long-acting). With the possible exception of prolonged sympathetic blockade from the local anesthetics, benefits during this period are typically attributed to, or associated with, other factors such as analgesic sensory neuropraxia, antiinflammatory effects, or beneficial biochemical changes provided by agents other than the local anesthetics.  Reported result: Extended relief following procedure: 70 %(lasted 2 weeks) (Long-Term Relief)            Interpretative annotation: Clinically appropriate result. Good relief. No permanent benefit expected. Inflammation plays a part in the etiology to the pain.          Current benefits: Defined as reported results that persistent at this point in time.   Analgesia: <50 %            Function: Somewhat improved ROM: Somewhat improved Interpretative annotation: Recurrence of symptoms. No permanent benefit expected. Effective diagnostic intervention.          Interpretation: Results would suggest a successful diagnostic intervention.                  Plan:  Repeat treatment or therapy and compare extent and duration of benefits.                Laboratory Chemistry  Inflammation Markers (CRP: Acute Phase) (ESR: Chronic Phase) No results found for: CRP, ESRSEDRATE, LATICACIDVEN                       Rheumatology Markers No results found for: RF, ANA, LABURIC, URICUR, LYMEIGGIGMAB, LYMEABIGMQN, HLAB27                      Renal Function Markers Lab Results  Component Value Date   BUN 15 05/21/2016    CREATININE 0.88 05/21/2016   GFRAA >60 05/21/2016   GFRNONAA >60 05/21/2016                             Hepatic Function Markers Lab Results  Component Value Date   AST 15 05/21/2016   ALT 11 (L) 05/21/2016   ALBUMIN 4.2 05/21/2016   ALKPHOS 51 05/21/2016   LIPASE 22 05/21/2016                        Electrolytes Lab Results  Component Value Date   NA 138 05/21/2016   K 4.1 05/21/2016   CL 104 05/21/2016   CALCIUM 9.3 05/21/2016                        Neuropathy Markers Lab Results  Component Value Date   VITAMINB12 295 12/11/2013  Bone Pathology Markers No results found for: VD25OH, H139778, XB1478GN5, AO1308MV7, 25OHVITD1, 25OHVITD2, 25OHVITD3, TESTOFREE, TESTOSTERONE                       Coagulation Parameters Lab Results  Component Value Date   PLT 230 05/21/2016                        Cardiovascular Markers Lab Results  Component Value Date   HGB 13.5 05/21/2016   HCT 39.0 05/21/2016                         CA Markers No results found for: CEA, CA125, LABCA2                      Note: Lab results reviewed.  Recent Diagnostic Imaging Results  DG C-Arm 1-60 Min-No Report Fluoroscopy was utilized by the requesting physician.  No radiographic  interpretation.   Complexity Note: Imaging results reviewed. Results shared with Patty Kennedy, using Layman's terms.                         Meds   Current Outpatient Medications:  .  amitriptyline (ELAVIL) 25 MG tablet, Take by mouth., Disp: , Rfl:  .  fexofenadine (ALLEGRA) 180 MG tablet, Take by mouth., Disp: , Rfl:  .  hydrochlorothiazide (HYDRODIURIL) 12.5 MG tablet, Take by mouth., Disp: , Rfl:  .  meloxicam (MOBIC) 15 MG tablet, Take 15 mg by mouth daily., Disp: , Rfl:  .  polyethylene glycol (MIRALAX / GLYCOLAX) packet, Take 17 g by mouth daily., Disp: 14 each, Rfl: 0 .  pregabalin (LYRICA) 75 MG capsule, Take 1 capsule (75 mg total) by mouth 2 (two) times daily., Disp: 60  capsule, Rfl: 3 .  promethazine (PHENERGAN) 12.5 MG tablet, Take 1 tablet (12.5 mg total) by mouth every 8 (eight) hours as needed for nausea or vomiting., Disp: 30 tablet, Rfl: 1 .  ibuprofen (ADVIL,MOTRIN) 800 MG tablet, Take 800 mg by mouth daily. , Disp: , Rfl:  .  Melatonin 5 MG TABS, Take 5 mg by mouth daily., Disp: , Rfl:   ROS  Constitutional: Denies any fever or chills Gastrointestinal: No reported hemesis, hematochezia, vomiting, or acute GI distress Musculoskeletal: Denies any acute onset joint swelling, redness, loss of ROM, or weakness Neurological: No reported episodes of acute onset apraxia, aphasia, dysarthria, agnosia, amnesia, paralysis, loss of coordination, or loss of consciousness  Allergies  Patty Kennedy has No Known Allergies.  Patty Kennedy  Drug: Patty Kennedy  reports that she does not use drugs. Alcohol:  reports that she does not drink alcohol. Tobacco:  reports that she has never smoked. She has never used smokeless tobacco. Medical:  has a past medical history of Arthritis, Depression, and Fibromyalgia. Surgical: Patty Kennedy  has a past surgical history that includes Tubal ligation (2006); Knee surgery (Bilateral, 1999, 2000); and Tubal ligation. Family: family history includes Alcohol abuse in her sister; Arthritis in her mother and paternal grandmother; Cancer (age of onset: 10) in her father; Depression in her mother; Hypertension in her sister.  Constitutional Exam  General appearance: Well nourished, well developed, and well hydrated. In no apparent acute distress Vitals:   05/03/18 1207  BP: (!) 142/96  Pulse: 75  Resp: 18  Temp: 99.1 F (37.3 C)  TempSrc: Oral  SpO2: 100%  Weight: 195 lb (88.5  kg)  Height: 5' 5"  (1.651 m)   BMI Assessment: Estimated body mass index is 32.45 kg/m as calculated from the following:   Height as of this encounter: 5' 5"  (1.651 m).   Weight as of this encounter: 195 lb (88.5 kg).  BMI interpretation table: BMI level Category  Range association with higher incidence of chronic pain  <18 kg/m2 Underweight   18.5-24.9 kg/m2 Ideal body weight   25-29.9 kg/m2 Overweight Increased incidence by 20%  30-34.9 kg/m2 Obese (Class I) Increased incidence by 68%  35-39.9 kg/m2 Severe obesity (Class II) Increased incidence by 136%  >40 kg/m2 Extreme obesity (Class III) Increased incidence by 254%   Patient's current BMI Ideal Body weight  Body mass index is 32.45 kg/m. Ideal body weight: 57 kg (125 lb 10.6 oz) Adjusted ideal body weight: 69.6 kg (153 lb 6.4 oz)   BMI Readings from Last 4 Encounters:  05/03/18 32.45 kg/m  04/02/18 32.45 kg/m  03/21/18 32.28 kg/m  05/21/16 35.24 kg/m   Wt Readings from Last 4 Encounters:  05/03/18 195 lb (88.5 kg)  04/02/18 195 lb (88.5 kg)  03/21/18 194 lb (88 kg)  05/21/16 225 lb (102.1 kg)  Psych/Mental status: Alert, oriented x 3 (person, place, & time)       Eyes: PERLA Respiratory: No evidence of acute respiratory distress  Cervical Spine Area Exam  Skin & Axial Inspection: No masses, redness, edema, swelling, or associated skin lesions Alignment: Symmetrical Functional ROM: Unrestricted ROM      Stability: No instability detected Muscle Tone/Strength: Functionally intact. No obvious neuro-muscular anomalies detected. Sensory (Neurological): Unimpaired Palpation: No palpable anomalies              Upper Extremity (UE) Exam    Side: Right upper extremity  Side: Left upper extremity  Skin & Extremity Inspection: Skin color, temperature, and hair growth are WNL. No peripheral edema or cyanosis. No masses, redness, swelling, asymmetry, or associated skin lesions. No contractures.  Skin & Extremity Inspection: Skin color, temperature, and hair growth are WNL. No peripheral edema or cyanosis. No masses, redness, swelling, asymmetry, or associated skin lesions. No contractures.  Functional ROM: Unrestricted ROM          Functional ROM: Unrestricted ROM          Muscle  Tone/Strength: Functionally intact. No obvious neuro-muscular anomalies detected.  Muscle Tone/Strength: Functionally intact. No obvious neuro-muscular anomalies detected.  Sensory (Neurological): Unimpaired          Sensory (Neurological): Unimpaired          Palpation: No palpable anomalies              Palpation: No palpable anomalies              Provocative Test(s):  Phalen's test: deferred Tinel's test: deferred Apley's scratch test (touch opposite shoulder):  Action 1 (Across chest): deferred Action 2 (Overhead): deferred Action 3 (LB reach): deferred   Provocative Test(s):  Phalen's test: deferred Tinel's test: deferred Apley's scratch test (touch opposite shoulder):  Action 1 (Across chest): deferred Action 2 (Overhead): deferred Action 3 (LB reach): deferred    Thoracic Spine Area Exam  Skin & Axial Inspection: No masses, redness, or swelling Alignment: Symmetrical Functional ROM: Unrestricted ROM Stability: No instability detected Muscle Tone/Strength: Functionally intact. No obvious neuro-muscular anomalies detected. Sensory (Neurological): Unimpaired Muscle strength & Tone: No palpable anomalies  Lumbar Spine Area Exam  Skin & Axial Inspection: No masses, redness, or swelling Alignment: Symmetrical Functional ROM: Unrestricted  ROM       Stability: No instability detected Muscle Tone/Strength: Functionally intact. No obvious neuro-muscular anomalies detected. Sensory (Neurological): Unimpaired Palpation: No palpable anomalies       Provocative Tests: Lumbar Hyperextension/rotation test: deferred today       Lumbar quadrant test (Kemp's test): deferred today       Lumbar Lateral bending test: deferred today       Patrick's Maneuver: deferred today                   FABER test: deferred today       Thigh-thrust test: deferred today       S-I compression test: deferred today       S-I distraction test: deferred today        Gait & Posture Assessment   Ambulation: Unassisted Gait: Relatively normal for age and body habitus Posture: WNL   Lower Extremity Exam    Side: Right lower extremity  Side: Left lower extremity  Stability: No instability observed          Stability: No instability observed          Skin & Extremity Inspection: Skin color, temperature, and hair growth are WNL. No peripheral edema or cyanosis. No masses, redness, swelling, asymmetry, or associated skin lesions. No contractures.  Skin & Extremity Inspection: Skin color, temperature, and hair growth are WNL. No peripheral edema or cyanosis. No masses, redness, swelling, asymmetry, or associated skin lesions. No contractures.  Functional ROM: Unrestricted ROM                  Functional ROM: Unrestricted ROM                  Muscle Tone/Strength: Functionally intact. No obvious neuro-muscular anomalies detected.  Muscle Tone/Strength: Functionally intact. No obvious neuro-muscular anomalies detected.  Sensory (Neurological): Arthropathic arthralgia  Sensory (Neurological): Unimpaired  Palpation: No palpable anomalies  Palpation: No palpable anomalies   Assessment  Primary Diagnosis & Pertinent Problem List: The primary encounter diagnosis was Primary osteoarthritis of right knee. Diagnoses of Chronic pain of right knee, Chronic pain syndrome, and Class 1 obesity without serious comorbidity with body mass index (BMI) of 32.0 to 32.9 in adult, unspecified obesity type were also pertinent to this visit.  Status Diagnosis  Responding Responding Responding 1. Primary osteoarthritis of right knee   2. Chronic pain of right knee   3. Chronic pain syndrome   4. Class 1 obesity without serious comorbidity with body mass index (BMI) of 32.0 to 32.9 in adult, unspecified obesity type      47 year old female with a history of obesity, right knee osteoarthritis who follows up status post right knee genicular nerve block performed on 04/02/2018.  Patient notes greater than 75% pain  relief of her right knee pain along with improvement in her ability to ambulate for an extended period of time for approximately 2 weeks after the block.  She states that during the last week and a half, she has noticed return of her pain however not to the level it was prior to her block.  She is pleased with the results that she obtained and is interested in repeating the block.  We will plan for repeat right genicular nerve block #2 with possible genicular nerve radio frequency ablation thereafter.  Plan of Care  Lab-work, procedure(s), and/or referral(s): Orders Placed This Encounter  Procedures  . GENICULAR NERVE BLOCK    Provider-requested follow-up: Return in about 3  weeks (around 05/24/2018).   Time Note: Greater than 50% of the 25 minute(s) of face-to-face time spent with Patty Kennedy, was spent in counseling/coordination of care regarding: Patty Kennedy primary cause of pain, the treatment plan, treatment alternatives, the risks and possible complications of proposed treatment, going over the informed consent, the results, interpretation and significance of  her recent diagnostic interventional treatment(s), realistic expectations, the goals of pain management (increased in functionality) and the need to bring and keep the BMI below 30.  Future Appointments  Date Time Provider [place]  05/23/2018  9:30 AM Gillis Santa, MD [facility] None    Primary Care Physician: Ricardo Jericho, NP Location: [facility] Note by: Gillis Santa, M.D Date: 05/03/2018; Time: 12:34 PM  Patient Instructions  ____________________________________________________________________________________________  Genicular Nerve Block  What is a genicular nerve block? A genicular nerve block is the injection of a local anesthetic to block the nerves that transmits pain from the knee.  What is the purpose of a facet nerve block? A genicular nerve block is a diagnostic  procedure to determine if the pathologic changes (i.e. arthritis, meniscal tears, etc) and inflammation within the knee joint is the source of your knee pain. It also confirms that the knee pain will respond well to the actual treatment procedure. If a genicular nerve block works, it will give you relief for several hours. After that, the pain is expected to return to normal. This test is always performed twice (usually a week or two apart) because two successful tests are required to move onto treatment. If both diagnostic tests are positive, then we schedule a treatment called radiofrequency (RF) ablation. In this procedure, the same nerves are cauterized, which typically leads to pain relief for 4 -18 months. If this process works well for one knee, it can be performed on the other knee if needed.  How is the procedure performed? You will be placed on the procedure table. The injection site is sterilized with either iodine or chlorhexadine. The site to be injected is numbed with a local anesthetic, and a needle is directed to the target area. X-ray guidance is used to ensure proper placement and positioning of the needle. When the needle is properly positioned near the genicular nerve, local anesthetic is injected to numb that nerve. This will be repeated at multiple sites around the knee to block all genicular nerves.  Will the procedure be painful? The injection can be painful and we therefore provide the option of receiving IV sedation. IV sedation, combined with local anesthetic, can make the injection nearly pain free. It allows you to remain very still during the procedure, which can also make the injection easier, faster, and more successful. If you decide to have IV sedation, you must have a driver to get you home safely afterwards. In addition, you cannot have anything to eat or drink within 8 hours of your appointment (clear liquids are allowed until 3 hours before the procedure). If you take  medications for diabetes, these medications may need to be adjusted the morning of the procedure. Your primary care physician can help you with this adjustment.  What are the discharge instructions? If you received IV sedation do not drive or operate machinery for at least 24 hours after the procedure. You may return to work the next day following your procedure. You may resume your normal diet immediately. Do not engage in any strenuous activity for 24 hours. You should, however, engage in moderate activity  that typically causes your ususal pain. If the block works, those activities should not be painful for several hours after the injection. Do not take a bath, swim, or use a hot tub for 24 hours (you may take a shower). Call the office if you have any of the following: severe pain afterwards (different than your usual symptoms), redness/swelling/discharge at the injection site(s), fevers/chills, difficulty with bowel or bladder functions.  What are the risks and side effects? The complication rate for this procedure is very low. Whenever a needle enters the skin, bleeding or infection can occur. Some other serious but extremely rare risks include paralysis and death. You may have an allergic reaction to any of the medications used. If you have a known allergy to any medications, especially local anesthetics, notify our staff before the procedure takes place. You may experience any of the following side effects up to 4 - 6 hours after the procedure: . Leg muscle weakness or numbness may occur due to the local anesthetic affecting the nerves that control your legs (this is a temporary affect and it is not paralysis). If you have any leg weakness or numbness, walk only with assistance in order to prevent falls and injury. Your leg strength will return slowly and completely. . Dizziness may occur due to a decrease in your blood pressure. If this occurs, remain in a seated or lying position. Gradually sit  up, and then stand after at least 10 minutes of sitting. . Mild headaches may occur. Drink fluids and take pain medications if needed. If the headaches persist or become severe, call the office. . Mild discomfort at the injection site can occur. This typically lasts for a few hours but can persist for a couple days. If this occurs, take anti-inflammatories or pain medications, apply ice to the area the day of the procedure. If it persists, apply moist heat in the day(s) following.  The side effects listed above can be normal. They are not dangerous and will resolve on their own. If, however, you experience any of the following, a complication may have occurred and you should either contact your doctor. If he is not readily available, then you should proceed to the closest urgent care center for evaluation: . Severe or progressive pain at the injection site(s) . Arm or leg weakness that progressively worsens or persists for longer than 8 hours . Severe or progressive redness, swelling, or discharge from the injections site(s) . Fevers, chills, nausea, or vomiting . Bowel or bladder dysfunction (i.e. inability to urinate or pass stool or difficulty controlling either)  How long does it take for the procedure to work? You should feel relief from your usual pain within the first hour. Again, this is only expected to last for several hours, at the most. Remember, you may be sore in the middle part of your back from the needles, and you must distinguish this from your usual pain. ____________________________________________________________________________________________

## 2018-05-03 NOTE — Progress Notes (Signed)
Safety precautions to be maintained throughout the outpatient stay will include: orient to surroundings, keep bed in low position, maintain call bell within reach at all times, provide assistance with transfer out of bed and ambulation.  

## 2018-05-03 NOTE — Patient Instructions (Signed)
____________________________________________________________________________________________ ° °Genicular Nerve Block ° °What is a genicular nerve block? °A genicular nerve block is the injection of a local anesthetic to block the nerves that transmits pain from the knee. ° °What is the purpose of a facet nerve block? °A genicular nerve block is a diagnostic procedure to determine if the pathologic changes (i.e. arthritis, meniscal tears, etc) and inflammation within the knee joint is the source of your knee pain. It also confirms that the knee pain will respond well to the actual treatment procedure. If a genicular nerve block works, it will give you relief for several hours. After that, the pain is expected to return to normal. This test is always performed twice (usually a week or two apart) because two successful tests are required to move onto treatment. If both diagnostic tests are positive, then we schedule a treatment called radiofrequency (RF) ablation. In this procedure, the same nerves are cauterized, which typically leads to pain relief for 4 -18 months. If this process works well for one knee, it can be performed on the other knee if needed. ° °How is the procedure performed? °You will be placed on the procedure table. The injection site is sterilized with either iodine or chlorhexadine. The site to be injected is numbed with a local anesthetic, and a needle is directed to the target area. X-ray guidance is used to ensure proper placement and positioning of the needle. When the needle is properly positioned near the genicular nerve, local anesthetic is injected to numb that nerve. This will be repeated at multiple sites around the knee to block all genicular nerves. ° °Will the procedure be painful? °The injection can be painful and we therefore provide the option of receiving IV sedation. IV sedation, combined with local anesthetic, can make the injection nearly pain free. It allows you to remain very  still during the procedure, which can also make the injection easier, faster, and more successful. If you decide to have IV sedation, you must have a driver to get you home safely afterwards. In addition, you cannot have anything to eat or drink within 8 hours of your appointment (clear liquids are allowed until 3 hours before the procedure). If you take medications for diabetes, these medications may need to be adjusted the morning of the procedure. Your primary care physician can help you with this adjustment. ° °What are the discharge instructions? °If you received IV sedation do not drive or operate machinery for at least 24 hours after the procedure. You may return to work the next day following your procedure. You may resume your normal diet immediately. Do not engage in any strenuous activity for 24 hours. You should, however, engage in moderate activity that typically causes your ususal pain. If the block works, those activities should not be painful for several hours after the injection. Do not take a bath, swim, or use a hot tub for 24 hours (you may take a shower). Call the office if you have any of the following: severe pain afterwards (different than your usual symptoms), redness/swelling/discharge at the injection site(s), fevers/chills, difficulty with bowel or bladder functions. ° °What are the risks and side effects? °The complication rate for this procedure is very low. Whenever a needle enters the skin, bleeding or infection can occur. Some other serious but extremely rare risks include paralysis and death. °You may have an allergic reaction to any of the medications used. If you have a known allergy to any medications, especially local anesthetics, notify   our staff before the procedure takes place. °You may experience any of the following side effects up to 4 - 6 hours after the procedure: °• Leg muscle weakness or numbness may occur due to the local anesthetic affecting the nerves that control  your legs (this is a temporary affect and it is not paralysis). If you have any leg weakness or numbness, walk only with assistance in order to prevent falls and injury. Your leg strength will return slowly and completely. °• Dizziness may occur due to a decrease in your blood pressure. If this occurs, remain in a seated or lying position. Gradually sit up, and then stand after at least 10 minutes of sitting. °• Mild headaches may occur. Drink fluids and take pain medications if needed. If the headaches persist or become severe, call the office. °• Mild discomfort at the injection site can occur. This typically lasts for a few hours but can persist for a couple days. If this occurs, take anti-inflammatories or pain medications, apply ice to the area the day of the procedure. If it persists, apply moist heat in the day(s) following. ° °The side effects listed above can be normal. They are not dangerous and will resolve on their own. If, however, you experience any of the following, a complication may have occurred and you should either contact your doctor. If he is not readily available, then you should proceed to the closest urgent care center for evaluation: °• Severe or progressive pain at the injection site(s) °• Arm or leg weakness that progressively worsens or persists for longer than 8 hours °• Severe or progressive redness, swelling, or discharge from the injections site(s) °• Fevers, chills, nausea, or vomiting °• Bowel or bladder dysfunction (i.e. inability to urinate or pass stool or difficulty controlling either) ° °How long does it take for the procedure to work? °You should feel relief from your usual pain within the first hour. Again, this is only expected to last for several hours, at the most. Remember, you may be sore in the middle part of your back from the needles, and you must distinguish this from your usual  pain. °____________________________________________________________________________________________ ° ° °

## 2018-05-23 ENCOUNTER — Encounter: Payer: Self-pay | Admitting: Student in an Organized Health Care Education/Training Program

## 2018-05-23 ENCOUNTER — Ambulatory Visit
Admission: RE | Admit: 2018-05-23 | Discharge: 2018-05-23 | Disposition: A | Discharge status: Home / Self Care | Payer: 59 | Source: Ambulatory Visit | Attending: Student in an Organized Health Care Education/Training Program | Admitting: Student in an Organized Health Care Education/Training Program

## 2018-05-23 ENCOUNTER — Ambulatory Visit (HOSPITAL_BASED_OUTPATIENT_CLINIC_OR_DEPARTMENT_OTHER): Payer: 59 | Admitting: Student in an Organized Health Care Education/Training Program

## 2018-05-23 VITALS — BP 138/88 | HR 54 | Temp 97.6°F | Resp 13 | Ht 65.0 in | Wt 195.0 lb

## 2018-05-23 DIAGNOSIS — M25561 Pain in right knee: Secondary | ICD-10-CM

## 2018-05-23 DIAGNOSIS — M1711 Unilateral primary osteoarthritis, right knee: Secondary | ICD-10-CM

## 2018-05-23 DIAGNOSIS — G8929 Other chronic pain: Secondary | ICD-10-CM

## 2018-05-23 MED ORDER — LIDOCAINE HCL 1 % IJ SOLN
10.0000 mL | Freq: Once | INTRAMUSCULAR | Status: AC
  Administered 2018-05-23: 10 mL
  Filled 2018-05-23: qty 10

## 2018-05-23 MED ORDER — ROPIVACAINE HCL 2 MG/ML IJ SOLN
10.0000 mL | Freq: Once | INTRAMUSCULAR | Status: AC
  Administered 2018-05-23: 10 mL
  Filled 2018-05-23: qty 10

## 2018-05-23 MED ORDER — LACTATED RINGERS IV SOLN
1000.0000 mL | Freq: Once | INTRAVENOUS | Status: AC
  Administered 2018-05-23: 1000 mL via INTRAVENOUS

## 2018-05-23 MED ORDER — DEXAMETHASONE SODIUM PHOSPHATE 10 MG/ML IJ SOLN
10.0000 mg | Freq: Once | INTRAMUSCULAR | Status: AC
  Administered 2018-05-23: 10 mg
  Filled 2018-05-23: qty 1

## 2018-05-23 MED ORDER — FENTANYL CITRATE (PF) 100 MCG/2ML IJ SOLN
25.0000 ug | INTRAMUSCULAR | Status: DC | PRN
  Administered 2018-05-23: 100 ug via INTRAVENOUS
  Filled 2018-05-23: qty 2

## 2018-05-23 MED ORDER — LIDOCAINE HCL (PF) 1 % IJ SOLN
INTRAMUSCULAR | Status: AC
  Filled 2018-05-23: qty 5

## 2018-05-23 NOTE — Progress Notes (Signed)
Patient's Name: Patty Kennedy  MRN: [id number]  Referring Provider: Titus Mould*  DOB: November 03, 1971  PCP: Titus Mould, NP  DOS: 05/23/2018  Note by: Edward Jolly, MD  Service setting: Ambulatory outpatient  Specialty: Interventional Pain Management  Patient type: Established  Location: [facility] (AMB) Pain Management Facility  Visit type: Interventional Procedure   Primary Reason for Visit: Interventional Pain Management Treatment. CC: Knee Pain (right)  Procedure:  Anesthesia, Analgesia, Anxiolysis:  Type: Genicular Nerves Block (Superior-lateral, Superior-medial, and Inferior-medial Genicular Nerves) #2  CPT: 64450      Primary Purpose: Diagnostic Region: Lateral, Anterior, and Medial aspects of the knee joint, above and below the knee joint proper. Level: Superior and inferior to the knee joint. Target Area: For Genicular Nerve block(s), the targets are: the superior-lateral genicular nerve, located in the lateral distal portion of the femoral shaft as it curves to form the lateral epicondyle, in the region of the distal femoral metaphysis; the superior-medial genicular nerve, located in the medial distal portion of the femoral shaft as it curves to form the medial epicondyle; and the inferior-medial genicular nerve, located in the medial, proximal portion of the tibial shaft, as it curves to form the medial epicondyle, in the region of the proximal tibial metaphysis. Approach: Anterior, percutaneous, ipsilateral approach. Laterality: Right knee Position: Modified Fowler's position with pillows under the targeted knee(s).  Type: Moderate (Conscious) Sedation combined with Local Anesthesia Indication(s): Analgesia and Anxiety Route: Intravenous (IV) IV Access: Secured Sedation: Meaningful verbal contact was maintained at all times during the procedure  Local Anesthetic: Lidocaine 1%   Indications: 1. Primary osteoarthritis of right knee   2. Chronic pain of right knee     Pain Score: Pre-procedure: 8 /10 Post-procedure: 0-No pain/10  Pre-op Assessment:  Patty Kennedy is a 47 y.o. (year old), female patient, seen today for interventional treatment. She  has a past surgical history that includes Tubal ligation (2006); Knee surgery (Bilateral, 1999, 2000); and Tubal ligation. Patty Kennedy has a current medication list which includes the following prescription(s): amitriptyline, fexofenadine, hydrochlorothiazide, meloxicam, ibuprofen, melatonin, polyethylene glycol, pregabalin, and promethazine, and the following Facility-Administered Medications: fentanyl. Her primarily concern today is the Knee Pain (right)  Initial Vital Signs:  Pulse/HCG Rate: (!) 57ECG Heart Rate: 60 Temp: 98.8 F (37.1 C) Resp: 16 BP: (!) 139/92 SpO2: 100 %  BMI: Estimated body mass index is 32.45 kg/m as calculated from the following:   Height as of this encounter: 5\' 5"  (1.651 m).   Weight as of this encounter: 195 lb (88.5 kg).  Risk Assessment: Allergies: Reviewed. She has No Known Allergies.  Allergy Precautions: None required Coagulopathies: Reviewed. None identified.  Blood-thinner therapy: None at this time Active Infection(s): Reviewed. None identified. Patty Kennedy is afebrile  Site Confirmation: Patty Kennedy was asked to confirm the procedure and laterality before marking the site Procedure checklist: Completed Consent: Before the procedure and under the influence of no sedative(s), amnesic(s), or anxiolytics, the patient was informed of the treatment options, risks and possible complications. To fulfill our ethical and legal obligations, as recommended by the American Medical Association's Code of Ethics, I have informed the patient of my clinical impression; the nature and purpose of the treatment or procedure; the risks, benefits, and possible complications of the intervention; the alternatives, including doing nothing; the risk(s) and benefit(s) of the alternative treatment(s)  or procedure(s); and the risk(s) and benefit(s) of doing nothing. The patient was provided information about the general risks and possible complications associated  with the procedure. These may include, but are not limited to: failure to achieve desired goals, infection, bleeding, organ or nerve damage, allergic reactions, paralysis, and death. In addition, the patient was informed of those risks and complications associated to the procedure, such as failure to decrease pain; infection; bleeding; organ or nerve damage with subsequent damage to sensory, motor, and/or autonomic systems, resulting in permanent pain, numbness, and/or weakness of one or several areas of the body; allergic reactions; (i.e.: anaphylactic reaction); and/or death. Furthermore, the patient was informed of those risks and complications associated with the medications. These include, but are not limited to: allergic reactions (i.e.: anaphylactic or anaphylactoid reaction(s)); adrenal axis suppression; blood sugar elevation that in diabetics may result in ketoacidosis or comma; water retention that in patients with history of congestive heart failure may result in shortness of breath, pulmonary edema, and decompensation with resultant heart failure; weight gain; swelling or edema; medication-induced neural toxicity; particulate matter embolism and blood vessel occlusion with resultant organ, and/or nervous system infarction; and/or aseptic necrosis of one or more joints. Finally, the patient was informed that Medicine is not an exact science; therefore, there is also the possibility of unforeseen or unpredictable risks and/or possible complications that may result in a catastrophic outcome. The patient indicated having understood very clearly. We have given the patient no guarantees and we have made no promises. Enough time was given to the patient to ask questions, all of which were answered to the patient's satisfaction. Patty Kennedy has  indicated that she wanted to continue with the procedure. Attestation: I, the ordering provider, attest that I have discussed with the patient the benefits, risks, side-effects, alternatives, likelihood of achieving goals, and potential problems during recovery for the procedure that I have provided informed consent. Date  Time: 05/23/2018  9:08 AM  Pre-Procedure Preparation:  Monitoring: As per clinic protocol. Respiration, ETCO2, SpO2, BP, heart rate and rhythm monitor placed and checked for adequate function Safety Precautions: Patient was assessed for positional comfort and pressure points before starting the procedure. Time-out: I initiated and conducted the "Time-out" before starting the procedure, as per protocol. The patient was asked to participate by confirming the accuracy of the "Time Out" information. Verification of the correct person, site, and procedure were performed and confirmed by me, the nursing staff, and the patient. "Time-out" conducted as per Joint Commission's Universal Protocol (UP.01.01.01). Time: 1015  Description of Procedure Process:  Area Prepped: Entire knee area, from mid-thigh to mid-shin, lateral, anterior, and medial aspects. Prepping solution: ChloraPrep (2% chlorhexidine gluconate and 70% isopropyl alcohol) Safety Precautions: Aspiration looking for blood return was conducted prior to all injections. At no point did we inject any substances, as a needle was being advanced. No attempts were made at seeking any paresthesias. Safe injection practices and needle disposal techniques used. Medications properly checked for expiration dates. SDV (single dose vial) medications used. Latex Allergy precautions taken.   Description of the Procedure: Protocol guidelines were followed. The patient was placed in position over the procedure table. The target area was identified and the area prepped in the usual manner. Skin desensitized using vapocoolant spray. Skin & deeper  tissues infiltrated with local anesthetic. Appropriate amount of time allowed to pass for local anesthetics to take effect. The procedure needles were then advanced to the target area. Proper needle placement secured. Negative aspiration confirmed. Solution injected in intermittent fashion, asking for systemic symptoms every 0.5cc of injectate. The needles were then removed and the area cleansed, making sure  to leave some of the prepping solution back to take advantage of its long term bactericidal properties.  Vitals:   05/23/18 1025 05/23/18 1035 05/23/18 1045 05/23/18 1057  BP: (!) 144/82 137/73 114/71 138/88  Pulse: (!) 55 (!) 51 (!) 54   Resp: 16 14 17 13   Temp: 97.6 F (36.4 C)     TempSrc:      SpO2: 98% 100% 100% 100%  Weight:      Height:        Start Time: 1015 hrs. End Time: 1024 hrs. Materials:  Needle(s) Type: Regular needle Gauge: 22G Length: 3.5-in Medication(s): Please see orders for medications and dosing details. 5 cc solution made of 4 cc of 0.2% ropivacaine, 1 cc of Decadron 10 mg/cc.  1.5 cc injected at each level above.  Imaging Guidance (Non-Spinal):  Type of Imaging Technique: Fluoroscopy Guidance (Non-Spinal) Indication(s): Assistance in needle guidance and placement for procedures requiring needle placement in or near specific anatomical locations not easily accessible without such assistance. Exposure Time: Please see nurses notes. Contrast: Before injecting any contrast, we confirmed that the patient did not have an allergy to iodine, shellfish, or radiological contrast. Once satisfactory needle placement was completed at the desired level, radiological contrast was injected. Contrast injected under live fluoroscopy. No contrast complications. See chart for type and volume of contrast used. Fluoroscopic Guidance: I was personally present during the use of fluoroscopy. "Tunnel Vision Technique" used to obtain the best possible view of the target area. Parallax  error corrected before commencing the procedure. "Direction-depth-direction" technique used to introduce the needle under continuous pulsed fluoroscopy. Once target was reached, antero-posterior, oblique, and lateral fluoroscopic projection used confirm needle placement in all planes. Images permanently stored in EMR. Interpretation: I personally interpreted the imaging intraoperatively. Adequate needle placement confirmed in multiple planes. Appropriate spread of contrast into desired area was observed. No evidence of afferent or efferent intravascular uptake. Permanent images saved into the patient's record.  Antibiotic Prophylaxis:   Anti-infectives (From admission, onward)   None     Indication(s): None identified  Post-operative Assessment:  Post-procedure Vital Signs:  Pulse/HCG Rate: (!) 5460 Temp: 97.6 F (36.4 C) Resp: 13 BP: 138/88 SpO2: 100 %  EBL: None  Complications: No immediate post-treatment complications observed by team, or reported by patient.  Note: The patient tolerated the entire procedure well. A repeat set of vitals were taken after the procedure and the patient was kept under observation following institutional policy, for this type of procedure. Post-procedural neurological assessment was performed, showing return to baseline, prior to discharge. The patient was provided with post-procedure discharge instructions, including a section on how to identify potential problems. Should any problems arise concerning this procedure, the patient was given instructions to immediately contact us, at any time, without hesitation. In any case, we plan to contact the patient by telephone for a follow-up status report regarding this interventional procedure.  Comments:  No additional relevant information. 5 out of 5 strength bilateral lower extremity: Plantar flexion, dorsiflexion, knee flexion, knee extension.  Plan of Care    Imaging Orders     DG C-Arm 1-60 Min-No  Report Procedure Orders    No procedure(s) ordered today    Medications ordered for procedure: Meds ordered this encounter  Medications  . lactated ringers infusion 1,000 mL  . fentaNYL (SUBLIMAZE) injection 25-100 mcg    Make sure Narcan is available in the pyxis when using this medication. In the event of respiratory depression (RR< 8/min): Titrate  NARCAN (naloxone) in increments of 0.1 to 0.2 mg IV at 2-3 minute intervals, until desired degree of reversal.  . lidocaine (XYLOCAINE) 1 % (with pres) injection 10 mL  . ropivacaine (PF) 2 mg/mL (0.2%) (NAROPIN) injection 10 mL  . dexamethasone (DECADRON) injection 10 mg   Medications administered: We administered lactated ringers, fentaNYL, lidocaine, ropivacaine (PF) 2 mg/mL (0.2%), and dexamethasone.  See the medical record for exact dosing, route, and time of administration.  New Prescriptions   No medications on file   Disposition: Discharge home  Discharge Date & Time: 05/23/2018; 1058 hrs.   Physician-requested Follow-up: Return in about 2 weeks (around 06/06/2018) for Post Procedure Evaluation.  Future Appointments  Date Time Provider Department Center  06/05/2018  9:45 AM Edward Jolly, MD [facility] None   Primary Care Physician: Titus Mould, NP Location: [facility] Note by: Edward Jolly, MD Date: 05/23/2018; Time: 2:37 PM  Disclaimer:  Medicine is not an exact science. The only guarantee in medicine is that nothing is guaranteed. It is important to note that the decision to proceed with this intervention was based on the information collected from the patient. The Data and conclusions were drawn from the patient's questionnaire, the interview, and the physical examination. Because the information was provided in large part by the patient, it cannot be guaranteed that it has not been purposely or unconsciously manipulated. Every effort has been made to obtain as much relevant data as  possible for this evaluation. It is important to note that the conclusions that lead to this procedure are derived in large part from the available data. Always take into account that the treatment will also be dependent on availability of resources and existing treatment guidelines, considered by other Pain Management Practitioners as being common knowledge and practice, at the time of the intervention. For Medico-Legal purposes, it is also important to point out that variation in procedural techniques and pharmacological choices are the acceptable norm. The indications, contraindications, technique, and results of the above procedure should only be interpreted and judged by a Board-Certified Interventional Pain Specialist with extensive familiarity and expertise in the same exact procedure and technique.

## 2018-05-23 NOTE — Patient Instructions (Signed)

## 2018-05-24 ENCOUNTER — Telehealth: Payer: Self-pay

## 2018-05-24 NOTE — Telephone Encounter (Signed)
Post procedure phone call.  Left message.  

## 2018-06-05 ENCOUNTER — Encounter: Payer: Self-pay | Admitting: Student in an Organized Health Care Education/Training Program

## 2018-06-05 ENCOUNTER — Ambulatory Visit
Payer: 59 | Attending: Student in an Organized Health Care Education/Training Program | Admitting: Student in an Organized Health Care Education/Training Program

## 2018-06-05 ENCOUNTER — Other Ambulatory Visit: Payer: Self-pay

## 2018-06-05 VITALS — BP 133/90 | HR 62 | Temp 98.9°F | Resp 16 | Ht 65.0 in | Wt 195.0 lb

## 2018-06-05 DIAGNOSIS — M25561 Pain in right knee: Secondary | ICD-10-CM | POA: Insufficient documentation

## 2018-06-05 DIAGNOSIS — J029 Acute pharyngitis, unspecified: Secondary | ICD-10-CM | POA: Insufficient documentation

## 2018-06-05 DIAGNOSIS — R5383 Other fatigue: Secondary | ICD-10-CM | POA: Diagnosis not present

## 2018-06-05 DIAGNOSIS — E669 Obesity, unspecified: Secondary | ICD-10-CM | POA: Insufficient documentation

## 2018-06-05 DIAGNOSIS — M797 Fibromyalgia: Secondary | ICD-10-CM | POA: Diagnosis not present

## 2018-06-05 DIAGNOSIS — Z79899 Other long term (current) drug therapy: Secondary | ICD-10-CM | POA: Diagnosis not present

## 2018-06-05 DIAGNOSIS — G2581 Restless legs syndrome: Secondary | ICD-10-CM | POA: Insufficient documentation

## 2018-06-05 DIAGNOSIS — G8929 Other chronic pain: Secondary | ICD-10-CM | POA: Diagnosis present

## 2018-06-05 DIAGNOSIS — G894 Chronic pain syndrome: Secondary | ICD-10-CM | POA: Diagnosis not present

## 2018-06-05 DIAGNOSIS — M1711 Unilateral primary osteoarthritis, right knee: Secondary | ICD-10-CM | POA: Diagnosis not present

## 2018-06-05 DIAGNOSIS — Z6832 Body mass index (BMI) 32.0-32.9, adult: Secondary | ICD-10-CM

## 2018-06-05 NOTE — Progress Notes (Signed)
Safety precautions to be maintained throughout the outpatient stay will include: orient to surroundings, keep bed in low position, maintain call bell within reach at all times, provide assistance with transfer out of bed and ambulation.  

## 2018-06-05 NOTE — Progress Notes (Signed)
Patient's Name: Patty Kennedy  MRN: [id number]  Referring Provider: Ricardo Jericho*  DOB: Apr 15, 1971  PCP: Ricardo Jericho, NP  DOS: 06/05/2018  Note by: Gillis Santa, MD  Service setting: Ambulatory outpatient  Specialty: Interventional Pain Management  Location: [facility] (AMB) Pain Management Facility    Patient type: Established   Primary Reason(s) for Visit: Encounter for post-procedure evaluation of chronic illness with mild to moderate exacerbation CC: Knee Pain (right)  HPI  Patty Kennedy is a 47 y.o. year old, female patient, who comes today for a post-procedure evaluation. She has Fibromyalgia; Restless legs; Fatigue; Acute pharyngitis; Primary osteoarthritis of right knee; Chronic pain of right knee; Class 1 obesity without serious comorbidity with body mass index (BMI) of 32.0 to 32.9 in adult; and Chronic pain syndrome on their problem list. Her primarily concern today is the Knee Pain (right)  Pain Assessment: Location: Right Knee Radiating: pain is on the side and in back of knee Onset: More than a month ago Duration: Chronic pain Quality: Aching, Sharp, Dull, Shooting Severity: 1 /10 (subjective, self-reported pain score)  Note: Reported level is compatible with observation.                         When using our objective Pain Scale, levels between 6 and 10/10 are said to belong in an emergency room, as it progressively worsens from a 6/10, described as severely limiting, requiring emergency care not usually available at an outpatient pain management facility. At a 6/10 level, communication becomes difficult and requires great effort. Assistance to reach the emergency department may be required. Facial flushing and profuse sweating along with potentially dangerous increases in heart rate and blood pressure will be evident. Effect on ADL: activities limited Timing: Intermittent Modifying factors: GNB help some BP: 133/90  HR: 62  Patty Kennedy comes in today for  post-procedure evaluation after the treatment done on 05/24/2018.  Further details on both, my assessment(s), as well as the proposed treatment plan, please see below.  Post-Procedure Assessment  05/23/2018 Procedure: Right genicular nerve block #2 Pre-procedure pain score:  8/10 Post-procedure pain score: 0/10         Influential Factors: BMI: 32.45 kg/m Intra-procedural challenges: None observed.         Assessment challenges: None detected.              Reported side-effects: None.        Post-procedural adverse reactions or complications: None reported         Sedation: Please see nurses note. When no sedatives are used, the analgesic levels obtained are directly associated to the effectiveness of the local anesthetics. However, when sedation is provided, the level of analgesia obtained during the initial 1 hour following the intervention, is believed to be the result of a combination of factors. These factors may include, but are not limited to: 1. The effectiveness of the local anesthetics used. 2. The effects of the analgesic(s) and/or anxiolytic(s) used. 3. The degree of discomfort experienced by the patient at the time of the procedure. 4. The patients ability and reliability in recalling and recording the events. 5. The presence and influence of possible secondary gains and/or psychosocial factors. Reported result: Relief experienced during the 1st hour after the procedure: 100 % (Ultra-Short Term Relief)            Interpretative annotation: Clinically appropriate result. Analgesia during this period is likely to be Local Anesthetic and/or IV Sedative (Analgesic/Anxiolytic)  related.          Effects of local anesthetic: The analgesic effects attained during this period are directly associated to the localized infiltration of local anesthetics and therefore cary significant diagnostic value as to the etiological location, or anatomical origin, of the pain. Expected duration of relief  is directly dependent on the pharmacodynamics of the local anesthetic used. Long-acting (4-6 hours) anesthetics used.  Reported result: Relief during the next 4 to 6 hour after the procedure: 100 % (Short-Term Relief)            Interpretative annotation: Clinically appropriate result. Analgesia during this period is likely to be Local Anesthetic-related.          Long-term benefit: Defined as the period of time past the expected duration of local anesthetics (1 hour for short-acting and 4-6 hours for long-acting). With the possible exception of prolonged sympathetic blockade from the local anesthetics, benefits during this period are typically attributed to, or associated with, other factors such as analgesic sensory neuropraxia, antiinflammatory effects, or beneficial biochemical changes provided by agents other than the local anesthetics.  Reported result: Extended relief following procedure: 80 % (Long-Term Relief)            Interpretative annotation: Clinically appropriate result. Good relief. No permanent benefit expected. Inflammation plays a part in the etiology to the pain.          Current benefits: Defined as reported results that persistent at this point in time.   Analgesia: 50-75 %            Function: Somewhat improved ROM: Somewhat improved Interpretative annotation: Recurrence of symptoms. No permanent benefit expected. Effective diagnostic intervention.          Interpretation: Results would suggest a successful diagnostic intervention.                  Plan:  Proceed with Radiofrequency Ablation for the purpose of attaining long-term benefits.                Laboratory Chemistry  Inflammation Markers (CRP: Acute Phase) (ESR: Chronic Phase) No results found for: CRP, ESRSEDRATE, LATICACIDVEN                       Rheumatology Markers No results found for: RF, ANA, LABURIC, URICUR, LYMEIGGIGMAB, LYMEABIGMQN, HLAB27                      Renal Function Markers Lab Results   Component Value Date   BUN 15 05/21/2016   CREATININE 0.88 05/21/2016   GFRAA >60 05/21/2016   GFRNONAA >60 05/21/2016                             Hepatic Function Markers Lab Results  Component Value Date   AST 15 05/21/2016   ALT 11 (L) 05/21/2016   ALBUMIN 4.2 05/21/2016   ALKPHOS 51 05/21/2016   LIPASE 22 05/21/2016                        Electrolytes Lab Results  Component Value Date   NA 138 05/21/2016   K 4.1 05/21/2016   CL 104 05/21/2016   CALCIUM 9.3 05/21/2016                        Neuropathy Markers Lab Results  Component Value Date  GBTDVVOH60 295 12/11/2013                        Bone Pathology Markers No results found for: [place], [id number], [id number], [id number], 25OHVITD1, 25OHVITD2, 25OHVITD3, TESTOFREE, TESTOSTERONE                       Coagulation Parameters Lab Results  Component Value Date   PLT 230 05/21/2016                        Cardiovascular Markers Lab Results  Component Value Date   HGB 13.5 05/21/2016   HCT 39.0 05/21/2016                         CA Markers No results found for: CEA, CA125, LABCA2                      Note: Lab results reviewed.  Recent Diagnostic Imaging Results  DG C-Arm 1-60 Min-No Report Fluoroscopy was utilized by the requesting physician.  No radiographic  interpretation.   Complexity Note: Imaging results reviewed. Results shared with Patty Kennedy, using Layman's terms.                         Meds   Current Outpatient Medications:  .  amitriptyline (ELAVIL) 25 MG tablet, Take by mouth., Disp: , Rfl:  .  hydrochlorothiazide (HYDRODIURIL) 12.5 MG tablet, Take by mouth., Disp: , Rfl:  .  meloxicam (MOBIC) 15 MG tablet, Take 15 mg by mouth daily., Disp: , Rfl:  .  fexofenadine (ALLEGRA) 180 MG tablet, Take by mouth., Disp: , Rfl:  .  ibuprofen (ADVIL,MOTRIN) 800 MG tablet, Take 800 mg by mouth daily. , Disp: , Rfl:  .  Melatonin 5 MG TABS, Take 5 mg by mouth daily., Disp: , Rfl:   ROS   Constitutional: Denies any fever or chills Gastrointestinal: No reported hemesis, hematochezia, vomiting, or acute GI distress Musculoskeletal: Denies any acute onset joint swelling, redness, loss of ROM, or weakness Neurological: No reported episodes of acute onset apraxia, aphasia, dysarthria, agnosia, amnesia, paralysis, loss of coordination, or loss of consciousness  Allergies  Patty Kennedy has No Known Allergies.  [place]  Drug: Patty Kennedy  reports that she does not use drugs. Alcohol:  reports that she does not drink alcohol. Tobacco:  reports that she has never smoked. She has never used smokeless tobacco. Medical:  has a past medical history of Arthritis, Depression, and Fibromyalgia. Surgical: Patty Kennedy  has a past surgical history that includes Tubal ligation (2006); Knee surgery (Bilateral, 1999, 2000); and Tubal ligation. Family: family history includes Alcohol abuse in her sister; Arthritis in her mother and paternal grandmother; Cancer (age of onset: 24) in her father; Depression in her mother; Hypertension in her sister.  Constitutional Exam  General appearance: Well nourished, well developed, and well hydrated. In no apparent acute distress Vitals:   06/05/18 0933 06/05/18 0935  BP:  133/90  Pulse:  62  Resp:  16  Temp: 98.9 F (37.2 C) 98.9 F (37.2 C)  SpO2:  100%  Weight: 195 lb (88.5 kg)   Height: 5' 5"  (1.651 m)    BMI Assessment: Estimated body mass index is 32.45 kg/m as calculated from the following:   Height as of this encounter: 5' 5"  (1.651 m).   Weight as of  this encounter: 195 lb (88.5 kg).  BMI interpretation table: BMI level Category Range association with higher incidence of chronic pain  <18 kg/m2 Underweight   18.5-24.9 kg/m2 Ideal body weight   25-29.9 kg/m2 Overweight Increased incidence by 20%  30-34.9 kg/m2 Obese (Class I) Increased incidence by 68%  35-39.9 kg/m2 Severe obesity (Class II) Increased incidence by 136%  >40 kg/m2 Extreme  obesity (Class III) Increased incidence by 254%   Patient's current BMI Ideal Body weight  Body mass index is 32.45 kg/m. Ideal body weight: 57 kg (125 lb 10.6 oz) Adjusted ideal body weight: 69.6 kg (153 lb 6.4 oz)   BMI Readings from Last 4 Encounters:  06/05/18 32.45 kg/m  05/23/18 32.45 kg/m  05/03/18 32.45 kg/m  04/02/18 32.45 kg/m   Wt Readings from Last 4 Encounters:  06/05/18 195 lb (88.5 kg)  05/23/18 195 lb (88.5 kg)  05/03/18 195 lb (88.5 kg)  04/02/18 195 lb (88.5 kg)  Psych/Mental status: Alert, oriented x 3 (person, place, & time)       Eyes: PERLA Respiratory: No evidence of acute respiratory distress  Cervical Spine Area Exam  Skin & Axial Inspection: No masses, redness, edema, swelling, or associated skin lesions Alignment: Symmetrical Functional ROM: Unrestricted ROM      Stability: No instability detected Muscle Tone/Strength: Functionally intact. No obvious neuro-muscular anomalies detected. Sensory (Neurological): Unimpaired Palpation: No palpable anomalies              Upper Extremity (UE) Exam    Side: Right upper extremity  Side: Left upper extremity  Skin & Extremity Inspection: Skin color, temperature, and hair growth are WNL. No peripheral edema or cyanosis. No masses, redness, swelling, asymmetry, or associated skin lesions. No contractures.  Skin & Extremity Inspection: Skin color, temperature, and hair growth are WNL. No peripheral edema or cyanosis. No masses, redness, swelling, asymmetry, or associated skin lesions. No contractures.  Functional ROM: Unrestricted ROM          Functional ROM: Unrestricted ROM          Muscle Tone/Strength: Functionally intact. No obvious neuro-muscular anomalies detected.  Muscle Tone/Strength: Functionally intact. No obvious neuro-muscular anomalies detected.  Sensory (Neurological): Unimpaired          Sensory (Neurological): Unimpaired          Palpation: No palpable anomalies              Palpation: No  palpable anomalies              Provocative Test(s):  Phalen's test: deferred Tinel's test: deferred Apley's scratch test (touch opposite shoulder):  Action 1 (Across chest): deferred Action 2 (Overhead): deferred Action 3 (LB reach): deferred   Provocative Test(s):  Phalen's test: deferred Tinel's test: deferred Apley's scratch test (touch opposite shoulder):  Action 1 (Across chest): deferred Action 2 (Overhead): deferred Action 3 (LB reach): deferred    Thoracic Spine Area Exam  Skin & Axial Inspection: No masses, redness, or swelling Alignment: Symmetrical Functional ROM: Unrestricted ROM Stability: No instability detected Muscle Tone/Strength: Functionally intact. No obvious neuro-muscular anomalies detected. Sensory (Neurological): Unimpaired Muscle strength & Tone: No palpable anomalies  Lumbar Spine Area Exam  Skin & Axial Inspection: No masses, redness, or swelling Alignment: Symmetrical Functional ROM: Unrestricted ROM       Stability: No instability detected Muscle Tone/Strength: Functionally intact. No obvious neuro-muscular anomalies detected. Sensory (Neurological): Unimpaired Palpation: No palpable anomalies       Provocative Tests: Lumbar Hyperextension/rotation test: deferred  today       Lumbar quadrant test (Kemp's test): deferred today       Lumbar Lateral bending test: deferred today       Patrick's Maneuver: deferred today                   FABER test: deferred today                   Thigh-thrust test: deferred today       S-I compression test: deferred today       S-I distraction test: deferred today        Gait & Posture Assessment  Ambulation: Unassisted Gait: Relatively normal for age and body habitus Posture: WNL   Lower Extremity Exam    Side: Right lower extremity  Side: Left lower extremity  Stability: No instability observed          Stability: No instability observed          Skin & Extremity Inspection: Skin color, temperature, and  hair growth are WNL. No peripheral edema or cyanosis. No masses, redness, swelling, asymmetry, or associated skin lesions. No contractures.  Skin & Extremity Inspection: Skin color, temperature, and hair growth are WNL. No peripheral edema or cyanosis. No masses, redness, swelling, asymmetry, or associated skin lesions. No contractures.  Functional ROM: Improved after treatment for knee joint          Functional ROM: Unrestricted ROM                  Muscle Tone/Strength: Functionally intact. No obvious neuro-muscular anomalies detected.  Muscle Tone/Strength: Functionally intact. No obvious neuro-muscular anomalies detected.  Sensory (Neurological): Arthropathic arthralgia, improved after treatment  Sensory (Neurological): Unimpaired  Palpation: No palpable anomalies  Palpation: No palpable anomalies   Assessment  Primary Diagnosis & Pertinent Problem List: The primary encounter diagnosis was Primary osteoarthritis of right knee. Diagnoses of Chronic pain of right knee, Chronic pain syndrome, and Class 1 obesity without serious comorbidity with body mass index (BMI) of 32.0 to 32.9 in adult, unspecified obesity type were also pertinent to this visit.  Status Diagnosis  Responding Responding Responding 1. Primary osteoarthritis of right knee   2. Chronic pain of right knee   3. Chronic pain syndrome   4. Class 1 obesity without serious comorbidity with body mass index (BMI) of 32.0 to 32.9 in adult, unspecified obesity type      General Recommendations: The pain condition that the patient suffers from is best treated with a multidisciplinary approach that involves an increase in physical activity to prevent de-conditioning and worsening of the pain cycle, as well as psychological counseling (formal and/or informal) to address the co-morbid psychological affects of pain. Treatment will often involve judicious use of pain medications and interventional procedures to decrease the pain, allowing the  patient to participate in the physical activity that will ultimately produce long-lasting pain reductions. The goal of the multidisciplinary approach is to return the patient to a higher level of overall function and to restore their ability to perform activities of daily living.  47 year old female with a history of right knee osteoarthritis who follows up status post right genicular nerve block #2 endorsing significant pain relief and improvement in range of motion of her knee that is ongoing.  Patient currently rates her pain relief is approximately 70 to 80% and states that she is able to ambulate for a longer period of time and perform activities of daily living with  greater ease.  She does note return of pain along her lateral component after exertion.  We discussed the risks and benefits of right knee genicular nerve radio frequency ablation.  Patient would like to proceed.  Plan: -Right knee genicular RFA with sedation.  In 4 weeks.  Lab-work, procedure(s), and/or referral(s): Orders Placed This Encounter  Procedures  . Radiofrequency,Genicular   Time Note: Greater than 50% of the 25 minute(s) of face-to-face time spent with Patty Kennedy, was spent in counseling/coordination of care regarding: Patty Kennedy primary cause of pain, the treatment plan, treatment alternatives, the risks and possible complications of proposed treatment, going over the informed consent, the results, interpretation and significance of  her recent diagnostic interventional treatment(s), realistic expectations and the need to bring and keep the BMI below 30.  No future appointments.  Primary Care Physician: Ricardo Jericho, NP Location[facility] Note by: Gillis Santa, M.D Date: 06/05/2018; Time: 9:59 AM  Patient Instructions  Preparing for Procedure with Sedation Instructions: . Oral Intake: Do not eat or drink anything for at least 8 hours prior to your  procedure. . Transportation: Public transportation is not allowed. Bring an adult driver. The driver must be physically present in our waiting room before any procedure can be started. Marland Kitchen Physical Assistance: Bring an adult capable of physically assisting you, in the event you need help. . Blood Pressure Medicine: Take your blood pressure medicine with a sip of water the morning of the procedure. . Insulin: Take only  of your normal insulin dose. . Preventing infections: Shower with an antibacterial soap the morning of your procedure. . Build-up your immune system: Take 1000 mg of Vitamin C with every meal (3 times a day) the day prior to your procedure. . Pregnancy: If you are pregnant, call and cancel the procedure. . Sickness: If you have a cold, fever, or any active infections, call and cancel the procedure. . Arrival: You must be in the facility at least 30 minutes prior to your scheduled procedure. . Children: Do not bring children with you. . Dress appropriately: Bring dark clothing that you would not mind if they get stained. . Valuables: Do not bring any jewelry or valuables. Procedure appointments are reserved for interventional treatments only. Marland Kitchen No Prescription Refills. . No medication changes will be discussed during procedure appointments. . No disability issues will be discussed.

## 2018-06-05 NOTE — Patient Instructions (Signed)

## 2018-06-20 ENCOUNTER — Ambulatory Visit: Payer: Self-pay | Admitting: Student in an Organized Health Care Education/Training Program

## 2018-06-27 ENCOUNTER — Other Ambulatory Visit: Payer: Self-pay

## 2018-06-27 ENCOUNTER — Ambulatory Visit (HOSPITAL_BASED_OUTPATIENT_CLINIC_OR_DEPARTMENT_OTHER): Payer: 59 | Admitting: Student in an Organized Health Care Education/Training Program

## 2018-06-27 ENCOUNTER — Encounter: Payer: Self-pay | Admitting: Student in an Organized Health Care Education/Training Program

## 2018-06-27 ENCOUNTER — Ambulatory Visit
Admission: RE | Admit: 2018-06-27 | Discharge: 2018-06-27 | Disposition: A | Discharge status: Home / Self Care | Payer: 59 | Source: Ambulatory Visit | Attending: Student in an Organized Health Care Education/Training Program | Admitting: Student in an Organized Health Care Education/Training Program

## 2018-06-27 VITALS — BP 145/88 | HR 81 | Temp 97.6°F | Resp 14 | Ht 65.0 in | Wt 195.0 lb

## 2018-06-27 DIAGNOSIS — M1711 Unilateral primary osteoarthritis, right knee: Secondary | ICD-10-CM | POA: Insufficient documentation

## 2018-06-27 DIAGNOSIS — M25561 Pain in right knee: Secondary | ICD-10-CM

## 2018-06-27 DIAGNOSIS — G894 Chronic pain syndrome: Secondary | ICD-10-CM | POA: Insufficient documentation

## 2018-06-27 DIAGNOSIS — G8929 Other chronic pain: Secondary | ICD-10-CM | POA: Diagnosis not present

## 2018-06-27 MED ORDER — FENTANYL CITRATE (PF) 100 MCG/2ML IJ SOLN
INTRAMUSCULAR | Status: AC
Start: 2018-06-27 — End: 2018-06-27
  Filled 2018-06-27: qty 2

## 2018-06-27 MED ORDER — ROPIVACAINE HCL 2 MG/ML IJ SOLN
INTRAMUSCULAR | Status: AC
  Filled 2018-06-27: qty 10

## 2018-06-27 MED ORDER — LIDOCAINE HCL 2 % IJ SOLN
20.0000 mL | Freq: Once | INTRAMUSCULAR | Status: AC
  Administered 2018-06-27: 400 mg

## 2018-06-27 MED ORDER — LIDOCAINE HCL 2 % IJ SOLN
INTRAMUSCULAR | Status: AC
  Filled 2018-06-27: qty 20

## 2018-06-27 MED ORDER — FENTANYL CITRATE (PF) 100 MCG/2ML IJ SOLN
25.0000 ug | INTRAMUSCULAR | Status: DC | PRN
  Administered 2018-06-27: 100 ug via INTRAVENOUS

## 2018-06-27 MED ORDER — LACTATED RINGERS IV SOLN
1000.0000 mL | Freq: Once | INTRAVENOUS | Status: AC
  Administered 2018-06-27: 1000 mL via INTRAVENOUS

## 2018-06-27 MED ORDER — DEXAMETHASONE SODIUM PHOSPHATE 10 MG/ML IJ SOLN
10.0000 mg | Freq: Once | INTRAMUSCULAR | Status: AC
  Administered 2018-06-27: 10 mg

## 2018-06-27 MED ORDER — HYDROCODONE-ACETAMINOPHEN 7.5-325 MG PO TABS: 1.0000 | ORAL_TABLET | Freq: Three times a day (TID) | ORAL | 0 refills | Status: AC | PRN

## 2018-06-27 MED ORDER — DEXAMETHASONE SODIUM PHOSPHATE 10 MG/ML IJ SOLN
INTRAMUSCULAR | Status: AC
  Filled 2018-06-27: qty 1

## 2018-06-27 MED ORDER — ROPIVACAINE HCL 2 MG/ML IJ SOLN
10.0000 mL | Freq: Once | INTRAMUSCULAR | Status: AC
  Administered 2018-06-27: 10 mL

## 2018-06-27 NOTE — Progress Notes (Signed)
Patient's Name: Patty Kennedy  MRN: [id number]  Referring Provider: Gillis Santa, MD  DOB: Apr 13, 1971  PCP: Ricardo Jericho, NP  DOS: 06/27/2018  Note by: Gillis Santa, MD  Service setting: Ambulatory outpatient  Specialty: Interventional Pain Management  Patient type: Established  Location: [facility] (AMB) Pain Management Facility  Visit type: Interventional Procedure   Primary Reason for Visit: Interventional Pain Management Treatment. CC: Knee Pain (right)  Procedure:          Anesthesia, Analgesia, Anxiolysis:  Type: COOLIEF KNEE COOLED RADIOFREQUENCY  Cooled Genicular RFA  Region: Lateral, Anterior, and Medial aspects of the knee joint, above and below the knee joint proper. Level: Superior and inferior to the knee joint. Laterality: Right  Type: Moderate (Conscious) Sedation combined with Local Anesthesia Indication(s): Analgesia and Anxiety Route: Intravenous (IV) IV Access: Secured Sedation: Meaningful verbal contact was maintained at all times during the procedure  Local Anesthetic: Lidocaine 1-2%   Indications: 1. Chronic pain of right knee   2. Primary osteoarthritis of right knee   3. Chronic pain syndrome    Patty Kennedy has been dealing with the above chronic pain for longer than three months and has either failed to respond, was unable to tolerate, or simply did not get enough benefit from other more conservative therapies including, but not limited to: 1. Over-the-counter medications 2. Anti-inflammatory medications 3. Muscle relaxants 4. Membrane stabilizers 5. Opioids 6. Physical therapy 7. Modalities (Heat, ice, etc.) 8. Invasive techniques such as nerve blocks. Patty Kennedy has attained more than 50% relief of the pain from a series of diagnostic injections conducted in separate occasions.  Pain Score: Pre-procedure: 5 /10 Post-procedure: 0-No pain(took 5 steps with no pain)/10  Pre-op Assessment:  Patty Kennedy is a 47 y.o. (year old), female patient, seen  today for interventional treatment. She  has a past surgical history that includes Tubal ligation (2006); Knee surgery (Bilateral, 1999, 2000); and Tubal ligation. Patty Kennedy has a current medication list which includes the following prescription(s): amitriptyline, fexofenadine, hydrochlorothiazide, ibuprofen, melatonin, meloxicam, and hydrocodone-acetaminophen, and the following Facility-Administered Medications: fentanyl. Her primarily concern today is the Knee Pain (right)  Initial Vital Signs:  Pulse/HCG Rate: 81ECG Heart Rate: (!) 56 Temp: 98.6 F (37 C) Resp: 18 BP: 135/78 SpO2: 100 %  BMI: Estimated body mass index is 32.45 kg/m as calculated from the following:   Height as of this encounter: _0  (1.651 m).   Weight as of this encounter: 195 lb (88.5 kg).  Risk Assessment: Allergies: Reviewed. She has No Known Allergies.  Allergy Precautions: None required Coagulopathies: Reviewed. None identified.  Blood-thinner therapy: None at this time Active Infection(s): Reviewed. None identified. Patty Kennedy is afebrile  Site Confirmation: Patty Kennedy was asked to confirm the procedure and laterality before marking the site Procedure checklist: Completed Consent: Before the procedure and under the influence of no sedative(s), amnesic(s), or anxiolytics, the patient was informed of the treatment options, risks and possible complications. To fulfill our ethical and legal obligations, as recommended by the American Medical Association's Code of Ethics, I have informed the patient of my clinical impression; the nature and purpose of the treatment or procedure; the risks, benefits, and possible complications of the intervention; the alternatives, including doing nothing; the risk(s) and benefit(s) of the alternative treatment(s) or procedure(s); and the risk(s) and benefit(s) of doing nothing. The patient was provided information about the general risks and possible complications associated with the  procedure. These may include, but are not limited to: failure  to achieve desired goals, infection, bleeding, organ or nerve damage, allergic reactions, paralysis, and death. In addition, the patient was informed of those risks and complications associated to the procedure, such as failure to decrease pain; infection; bleeding; organ or nerve damage with subsequent damage to sensory, motor, and/or autonomic systems, resulting in permanent pain, numbness, and/or weakness of one or several areas of the body; allergic reactions; (i.e.: anaphylactic reaction); and/or death. Furthermore, the patient was informed of those risks and complications associated with the medications. These include, but are not limited to: allergic reactions (i.e.: anaphylactic or anaphylactoid reaction(s)); adrenal axis suppression; blood sugar elevation that in diabetics may result in ketoacidosis or comma; water retention that in patients with history of congestive heart failure may result in shortness of breath, pulmonary edema, and decompensation with resultant heart failure; weight gain; swelling or edema; medication-induced neural toxicity; particulate matter embolism and blood vessel occlusion with resultant organ, and/or nervous system infarction; and/or aseptic necrosis of one or more joints. Finally, the patient was informed that Medicine is not an exact science; therefore, there is also the possibility of unforeseen or unpredictable risks and/or possible complications that may result in a catastrophic outcome. The patient indicated having understood very clearly. We have given the patient no guarantees and we have made no promises. Enough time was given to the patient to ask questions, all of which were answered to the patient's satisfaction. Patty Kennedy has indicated that she wanted to continue with the procedure. Attestation: I, the ordering provider, attest that I have discussed with the patient the benefits, risks,  side-effects, alternatives, likelihood of achieving goals, and potential problems during recovery for the procedure that I have provided informed consent. Date  Time: 06/27/2018  9:54 AM  Pre-Procedure Preparation:  Monitoring: As per clinic protocol. Respiration, ETCO2, SpO2, BP, heart rate and rhythm monitor placed and checked for adequate function Safety Precautions: Patient was assessed for positional comfort and pressure points before starting the procedure. Time-out: I initiated and conducted the "Time-out" before starting the procedure, as per protocol. The patient was asked to participate by confirming the accuracy of the "Time Out" information. Verification of the correct person, site, and procedure were performed and confirmed by me, the nursing staff, and the patient. "Time-out" conducted as per Joint Commission's Universal Protocol (UP.01.01.01). Time: 1028  Description of Procedure:          Position: Supine Target Area: For Genicular Nerve block(s), the targets are: the superior-lateral genicular nerve, located in the lateral distal portion of the femoral shaft as it curves to form the lateral epicondyle, in the region of the distal femoral metaphysis; the superior-medial genicular nerve, located in the medial distal portion of the femoral shaft as it curves to form the medial epicondyle; and the inferior-medial genicular nerve, located in the medial, proximal portion of the tibial shaft, as it curves to form the medial epicondyle, in the region of the proximal tibial metaphysis. Approach: Anterior, ipsilateral approach. Area Prepped: Entire knee area, from mid-thigh to mid-shin, lateral, anterior, and medial aspects. Prepping solution: Hibiclens (4.0% Chlorhexidine gluconate solution) Safety Precautions: Aspiration looking for blood return was conducted prior to all injections. At no point did we inject any substances, as a needle was being advanced. No attempts were made at seeking any  paresthesias. Safe injection practices and needle disposal techniques used. Medications properly checked for expiration dates. SDV (single dose vial) medications used. Description of the Procedure: A 17 gauge 50 mm COOLIEF Cooled Radiofrequency needle with  a 4 mm active tip was placed overlying the RIGHT  knee joint and using fluoroscopic guidance the needle was advanced to a bony endpoint on the superiolateral portion of the femoral epicondyle of the RIGHT knee. A second needle was advanced to a bony endpoint on the superiomedial portion of the femoral epicondyle. A third needle was then placed over the inferiomedial portion of the tibial epicondyle until a bony endpoint was met.    Attempted aspiration yielded no blood. Lateral x-ray views showed all the needles with the Radiopaque Band at 50% depth of the femur and tibia.    Motor stimulation was tested at 2.0 volts with no leg movement. Images were saved in AP and lateral.  Then 1cc of 2% Lidocaine was slowly injected into each cannula. Then a radiofrequency ablation of each of the geniculate nerves were done at 80 C for 2 minutes and 30 seconds each. Then 1cc of a solution made of 2 cc of 0.2% Ropivacaine and 1 cc of Decadron 10 mg/cc was injected into each cannula and the needles were withdrawn.   Once the procedure was completed, the needles were then removed and the area cleansed, making sure to leave some of the prepping solution back to take advantage of its long term bactericidal properties.  Intra-operative Compliance: Compliant Vitals:   06/27/18 1103 06/27/18 1112 06/27/18 1122 06/27/18 1131  BP: (!) 141/84 (!) 114/53 (!) 103/91 (!) 145/88  Pulse:      Resp: _0 Temp:  (!) 97.4 F (36.3 C)  97.6 F (36.4 C)  TempSrc:      SpO2: 100% 100% 100% 100%  Weight:      Height:        Start Time: 1028 hrs. End Time: 1101 hrs. Materials & Medications:  Needle(s) Type: Teflon-coated, curved tip, Radiofrequency needle(s) Gauge:  22G Length: 10cm Medication(s): Please see orders for medications and dosing details. 6 cc solution made of 5 cc of 0.2% ropivacaine, 1 cc of Decadron 10 mg/cc.  2 cc injected at each level above prior to ablation after satisfactory sensorimotor testing. Imaging Guidance (Non-Spinal):          Type of Imaging Technique: Fluoroscopy Guidance (Non-Spinal) Indication(s): Assistance in needle guidance and placement for procedures requiring needle placement in or near specific anatomical locations not easily accessible without such assistance. Exposure Time: Please see nurses notes. Contrast: Before injecting any contrast, we confirmed that the patient did not have an allergy to iodine, shellfish, or radiological contrast. Once satisfactory needle placement was completed at the desired level, radiological contrast was injected. Contrast injected under live fluoroscopy. No contrast complications. See chart for type and volume of contrast used. Fluoroscopic Guidance: I was personally present during the use of fluoroscopy. "Tunnel Vision Technique" used to obtain the best possible view of the target area. Parallax error corrected before commencing the procedure. "Direction-depth-direction" technique used to introduce the needle under continuous pulsed fluoroscopy. Once target was reached, antero-posterior, oblique, and lateral fluoroscopic projection used confirm needle placement in all planes. Images permanently stored in EMR. Interpretation: I personally interpreted the imaging intraoperatively. Adequate needle placement confirmed in multiple planes. Appropriate spread of contrast into desired area was observed. No evidence of afferent or efferent intravascular uptake. Permanent images saved into the patient's record.  Antibiotic Prophylaxis:   Anti-infectives (From admission, onward)   None     Indication(s): None identified  Post-operative Assessment:  Post-procedure Vital Signs:  Pulse/HCG Rate:  8164 Temp: 97.6 F (36.4  C) Resp: 14 BP: (!) 145/88 SpO2: 100 %  EBL: None  Complications: No immediate post-treatment complications observed by team, or reported by patient.  Note: The patient tolerated the entire procedure well. A repeat set of vitals were taken after the procedure and the patient was kept under observation following institutional policy, for this type of procedure. Post-procedural neurological assessment was performed, showing return to baseline, prior to discharge. The patient was provided with post-procedure discharge instructions, including a section on how to identify potential problems. Should any problems arise concerning this procedure, the patient was given instructions to immediately contact us, at any time, without hesitation. In any case, we plan to contact the patient by telephone for a follow-up status report regarding this interventional procedure.  Comments:  No additional relevant information. 5 out of 5 strength bilateral lower extremity: Plantar flexion, dorsiflexion, knee flexion, knee extension.  Plan of Care    Imaging Orders     DG C-Arm 1-60 Min-No Report Procedure Orders    No procedure(s) ordered today    Medications ordered for procedure: Meds ordered this encounter  Medications  . lactated ringers infusion 1,000 mL  . fentaNYL (SUBLIMAZE) injection 25-100 mcg    Make sure Narcan is available in the pyxis when using this medication. In the event of respiratory depression (RR< 8/min): Titrate NARCAN (naloxone) in increments of 0.1 to 0.2 mg IV at 2-3 minute intervals, until desired degree of reversal.  . lidocaine (XYLOCAINE) 2 % (with pres) injection 400 mg  . ropivacaine (PF) 2 mg/mL (0.2%) (NAROPIN) injection 10 mL  . dexamethasone (DECADRON) injection 10 mg  . HYDROcodone-acetaminophen (NORCO) 7.5-325 MG tablet    Sig: Take 1 tablet by mouth 3 (three) times daily as needed for up to 7 days for moderate pain.    Dispense:  21 tablet     Refill:  0    Do not place this medication, or any other prescription from our practice, on "Automatic Refill". Patient may have prescription filled one day early if pharmacy is closed on scheduled refill date.   Medications administered: We administered lactated ringers, fentaNYL, lidocaine, ropivacaine (PF) 2 mg/mL (0.2%), and dexamethasone.  See the medical record for exact dosing, route, and time of administration.  New Prescriptions   HYDROCODONE-ACETAMINOPHEN (NORCO) 7.5-325 MG TABLET    Take 1 tablet by mouth 3 (three) times daily as needed for up to 7 days for moderate pain.   Disposition: Discharge home  Discharge Date & Time: 06/27/2018;   hrs.   Physician-requested Follow-up: Return in about 6 weeks (around 08/08/2018) for Post Procedure Evaluation.  Future Appointments  Date Time Provider [place]  08/08/2018 12:30 PM Gillis Santa, MD [facility] None   Primary Care Physician: Ricardo Jericho, NP Location: [facility] Out[facility] Management [facility] Note by: Gillis Santa, MD Date: 06/27/2018; Time: 1:27 PM  Disclaimer:  Medicine is not an exact science. The only guarantee in medicine is that nothing is guaranteed. It is important to note that the decision to proceed with this intervention was based on the information collected from the patient. The Data and conclusions were drawn from the patient's questionnaire, the interview, and the physical examination. Because the information was provided in large part by the patient, it cannot be guaranteed that it has not been purposely or unconsciously manipulated. Every effort has been made to obtain as much relevant data as possible for this evaluation. It is important to note that the conclusions that lead to this procedure are  derived in large part from the available data. Always take into account that the treatment will also be dependent on availability of resources and existing treatment guidelines, considered by other  Pain Management Practitioners as being common knowledge and practice, at the time of the intervention. For Medico-Legal purposes, it is also important to point out that variation in procedural techniques and pharmacological choices are the acceptable norm. The indications, contraindications, technique, and results of the above procedure should only be interpreted and judged by a Board-Certified Interventional Pain Specialist with extensive familiarity and expertise in the same exact procedure and technique.

## 2018-06-27 NOTE — Patient Instructions (Addendum)
You have a 7 day supply of Hydrocodone that was escribed to your pharmacy today for post RFA pain      Post-procedure Information What to expect: Most procedures involve the use of a local anesthetic (numbing medicine), and a steroid (anti-inflammatory medicine).  The local anesthetics may cause temporary numbness and weakness of the legs or arms, depending on the location of the block. This numbness/weakness may last 4-6 hours, depending on the local anesthetic used. In rare instances, it can last up to 24 hours. While numb, you must be very careful not to injure the extremity.  After any procedure, you could expect the pain to get better within 15-20 minutes. This relief is temporary and may last 4-6 hours. Once the local anesthetics wears off, you could experience discomfort, possibly more than usual, for up to 10 (ten) days. In the case of radiofrequencies, it may last up to 6 weeks. Surgeries may take up to 8 weeks for the healing process. The discomfort is due to the irritation caused by needles going through skin and muscle. To minimize the discomfort, we recommend using ice the first day, and heat from then on. The ice should be applied for 15 minutes on, and 15 minutes off. Keep repeating this cycle until bedtime. Avoid applying the ice directly to the skin, to prevent frostbite. Heat should be used daily, until the pain improves (4-10 days). Be careful not to burn yourself.  Occasionally you may experience muscle spasms or cramps. These occur as a consequence of the irritation caused by the needle sticks to the muscle and the blood that will inevitably be lost into the surrounding muscle tissue. Blood tends to be very irritating to tissues, which tend to react by going into spasm. These spasms may start the same day of your procedure, but they may also take days to develop. This late onset type of spasm or cramp is usually caused by electrolyte imbalances triggered by the steroids, at the level  of the kidney. Cramps and spasms tend to respond well to muscle relaxants, multivitamins (some are triggered by the procedure, but may have their origins in vitamin deficiencies), and "Gatorade", or any sports drinks that can replenish any electrolyte imbalances. (If you are a diabetic, ask your pharmacist to get you a sugar-free brand.) Warm showers or baths may also be helpful. Stretching exercises are highly recommended. General Instructions:  Be alert for signs of possible infection: redness, swelling, heat, red streaks, elevated temperature, and/or fever. These typically appear 4 to 6 days after the procedure. Immediately notify your doctor if you experience unusual bleeding, difficulty breathing, or loss of bowel or bladder control. If you experience increased pain, do not increase your pain medicine intake, unless instructed by your pain physician. Post-Procedure Care:  Be careful in moving about. Muscle spasms in the area of the injection may occur. Applying ice or heat to the area is often helpful. The incidence of spinal headaches after epidural injections ranges between 1.4% and 6%. If you develop a headache that does not seem to respond to conservative therapy, please let your physician know. This can be treated with an epidural blood patch.   Post-procedure numbness or redness is to be expected, however it should average 4 to 6 hours. If numbness and weakness of your extremities begins to develop 4 to 6 hours after your procedure, and is felt to be progressing and worsening, immediately contact your physician.   Diet:  If you experience nausea, do not eat until  this sensation goes away. If you had a "Stellate Ganglion Block" for upper extremity "Reflex Sympathetic Dystrophy", do not eat or drink until your hoarseness goes away. In any case, always start with liquids first and if you tolerate them well, then slowly progress to more solid foods. Activity:  For the first 4 to 6 hours after the  procedure, use caution in moving about as you may experience numbness and/or weakness. Use caution in cooking, using household electrical appliances, and climbing steps. If you need to reach your Doctor call our office: [phone] Monday-Thursday 8:00 am - 4:00 PM    Fridays: Closed     In case of an emergency: In case of emergency, call 911 or go to the nearest emergency room and have the physician there call us.  Interpretation of Procedure Every nerve block has two components: a diagnostic component, and a treatment component. Unrealistic expectations are the most common causes of "perceived failure".  In a perfect world, a single nerve block should be able to completely and permanently eliminate the pain. Sadly, the world is not perfect.  Most pain management nerve blocks are performed using local anesthetics and steroids. Steroids are responsible for any long-term benefit that you may experience. Their purpose is to decrease any chronic swelling that may exist in the area. Steroids begin to work immediately after being injected. However, most patients will not experience any benefits until 5 to 10 days after the injection, when the swelling has come down to the point where they can tell a difference. Steroids will only help if there is swelling to be treated. As such, they can assist with the diagnosis. If effective, they suggest an inflammatory component to the pain, and if ineffective, they rule out inflammation as the main cause or component of the problem. If the problem is one of mechanical compression, you will get no benefit from those steroids.   In the case of local anesthetics, they have a crucial role in the diagnosis of your condition. Most will begin to work within15 to 20 minutes after injection. The duration will depend on the type used (short- vs. Long-acting). It is of outmost importance that patients keep tract of their pain, after the procedure. To assist with this matter, a  "Post-procedure Pain Diary" is provided. Make sure to complete it and to bring it back to your follow-up appointment.  As long as the patient keeps accurate, detailed records of their symptoms after every procedure, and returns to have those interpreted, every procedure will provide us with invaluable information. Even a block that does not provide the patient with any relief, will always provide us with information about the mechanism and the origin of the pain. The only time a nerve block can be considered a waste of time is when patients do not keep track of the results, or do not keep their post-procedure appointment.  Reporting the results back to your physician The Pain Score  Pain is a subjective complaint. It cannot be seen, touched, or measured. We depend entirely on the patient's report of the pain in order to assess your condition and treatment. To evaluate the pain, we use a pain scale, where "0" means "No Pain", and a "10" is "the worst possible pain that you can even imagine" (i.e. something like been eaten alive by a shark or being torn apart by a lion).   You will frequently be asked to rate your pain. Please be as accurate, remember that medical decisions will  be based on your responses. Please do not rate your pain above a 10. Doing so is actually interpreted as "symptom magnification" (exaggeration), as well as lack of understanding with regards to the scale. To put this into perspective, when you tell us that your pain is at a 10 (ten), what you are saying is that there is nothing we can do to make this pain any worse. (Carefully think about that.)

## 2018-06-28 ENCOUNTER — Telehealth: Payer: Self-pay | Admitting: *Deleted

## 2018-06-28 NOTE — Telephone Encounter (Signed)
Spoke with patient re; procedure on yesterday, states she is a little sore at the injection site but pain is better.

## 2018-07-18 ENCOUNTER — Ambulatory Visit: Payer: 59 | Admitting: Student in an Organized Health Care Education/Training Program

## 2018-07-30 ENCOUNTER — Ambulatory Visit: Payer: 59 | Admitting: Student in an Organized Health Care Education/Training Program

## 2018-08-08 ENCOUNTER — Ambulatory Visit: Payer: Self-pay | Admitting: Student in an Organized Health Care Education/Training Program

## 2018-09-03 ENCOUNTER — Other Ambulatory Visit: Payer: Self-pay | Admitting: Family Medicine

## 2018-09-03 DIAGNOSIS — Z1231 Encounter for screening mammogram for malignant neoplasm of breast: Secondary | ICD-10-CM

## 2018-09-05 ENCOUNTER — Ambulatory Visit
Admission: RE | Admit: 2018-09-05 | Discharge: 2018-09-05 | Disposition: A | Discharge status: Home / Self Care | Payer: 59 | Source: Ambulatory Visit | Attending: Family Medicine | Admitting: Family Medicine

## 2018-09-05 DIAGNOSIS — Z1231 Encounter for screening mammogram for malignant neoplasm of breast: Secondary | ICD-10-CM

## 2018-09-13 ENCOUNTER — Ambulatory Visit
Payer: 59 | Attending: Student in an Organized Health Care Education/Training Program | Admitting: Student in an Organized Health Care Education/Training Program

## 2019-09-14 IMAGING — MG DIGITAL SCREENING BILATERAL MAMMOGRAM WITH TOMO AND CAD
8 series · 8 of 24 positions shown · non-contrast
Comparison: Previous exam(s).

CLINICAL DATA: Screening.

EXAM:
DIGITAL SCREENING BILATERAL MAMMOGRAM WITH TOMO AND CAD

[R CC synth-2D]
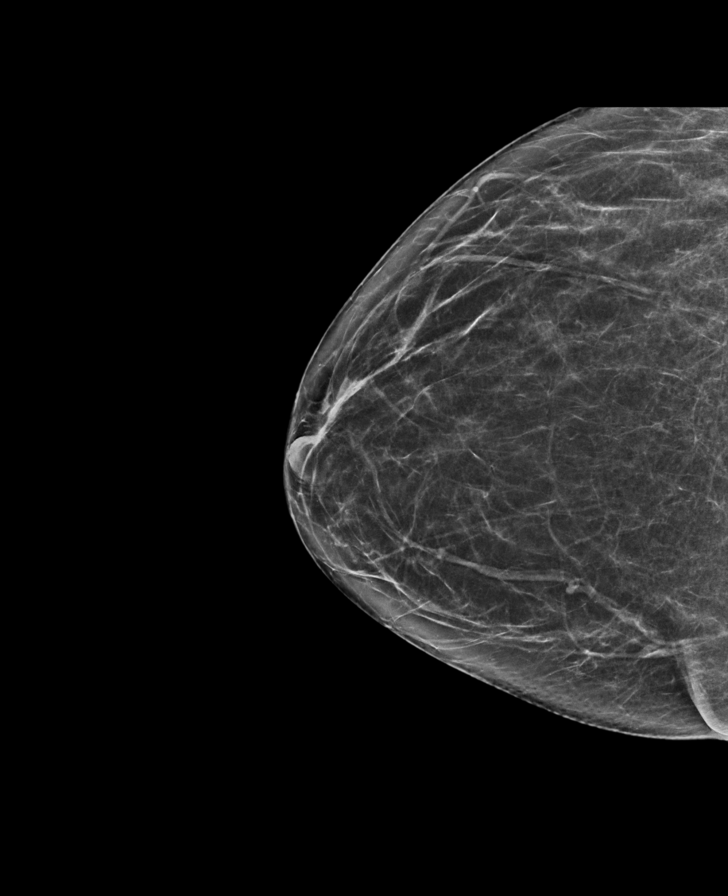

[L CC synth-2D]
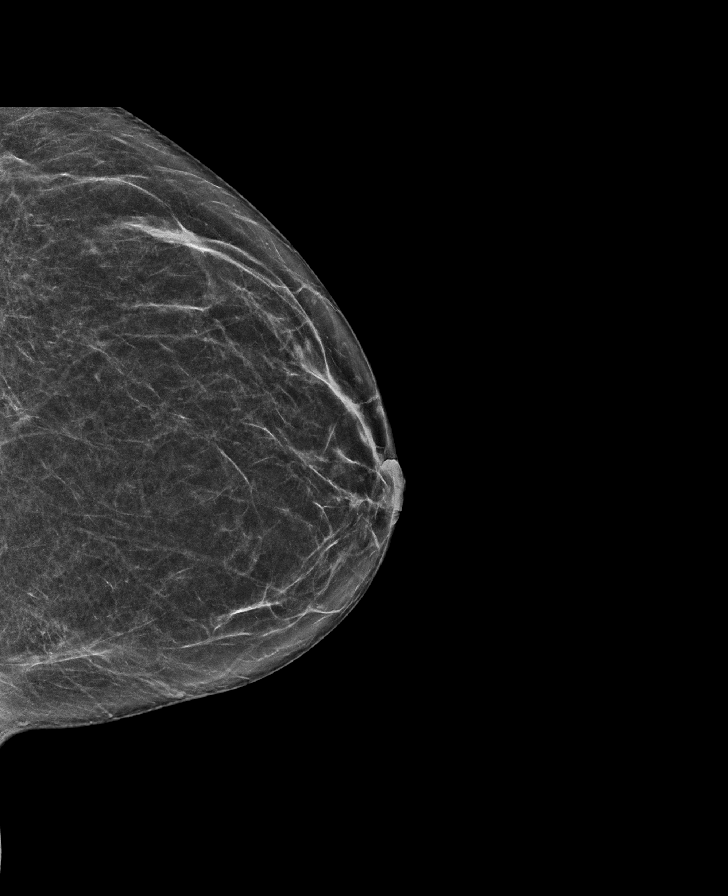

[R MLO synth-2D]
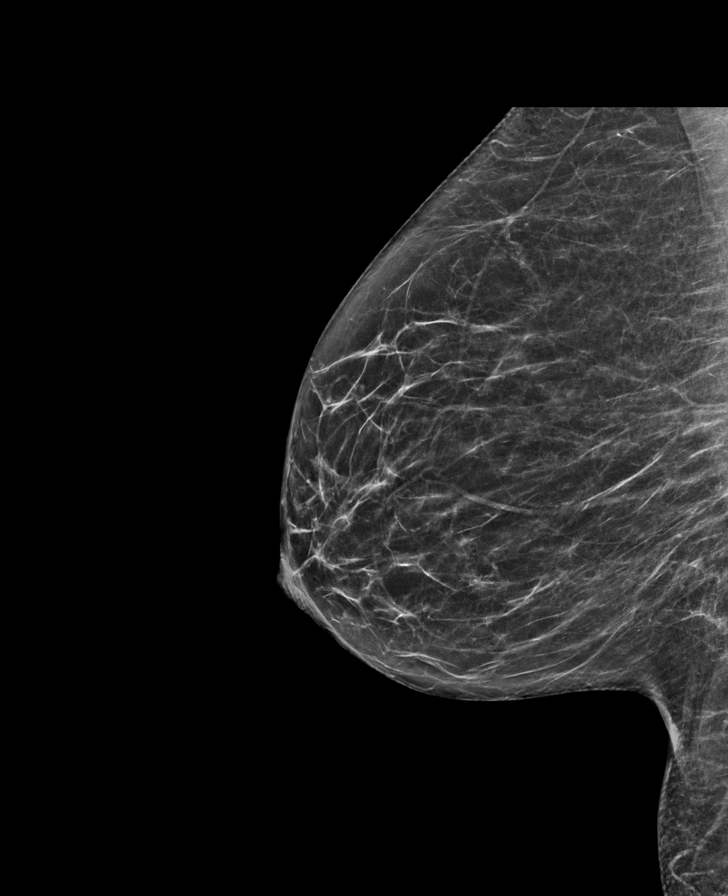

[L MLO synth-2D]
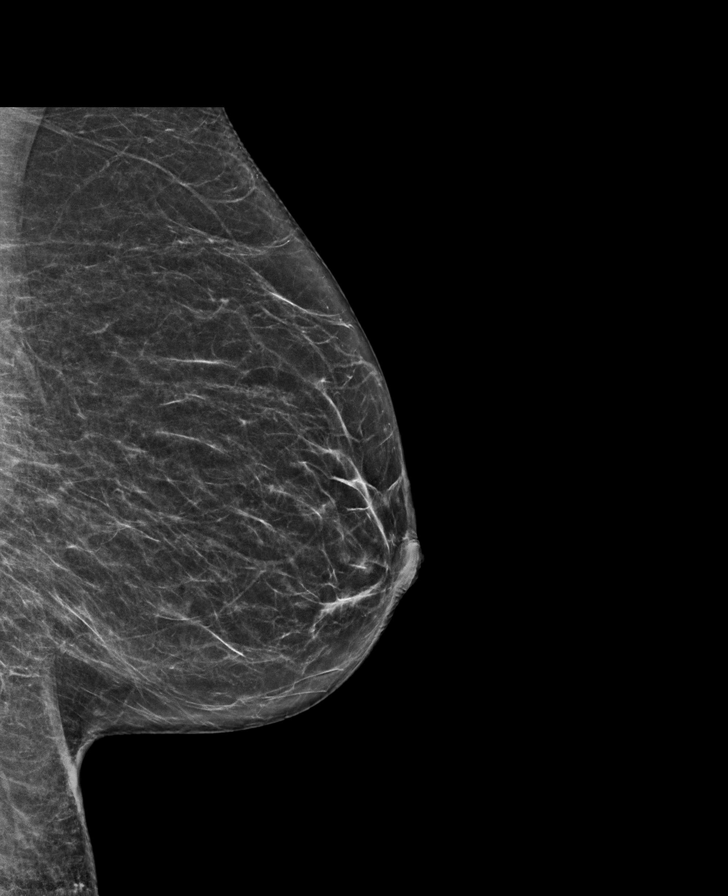

[R MLO tomo · tomo slice 32/63.0]
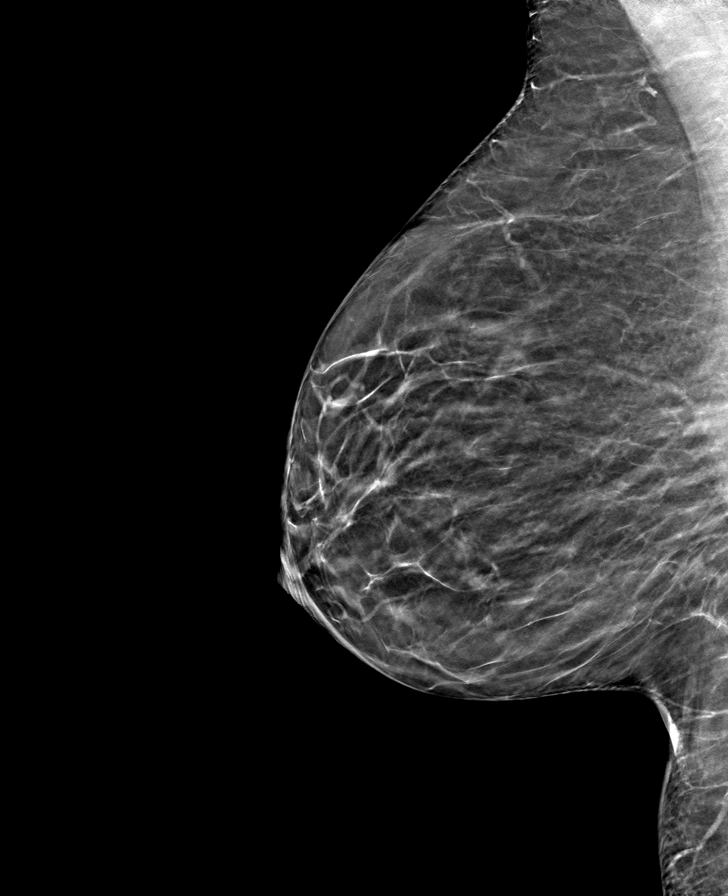

[L MLO tomo · tomo slice 33/64.0]
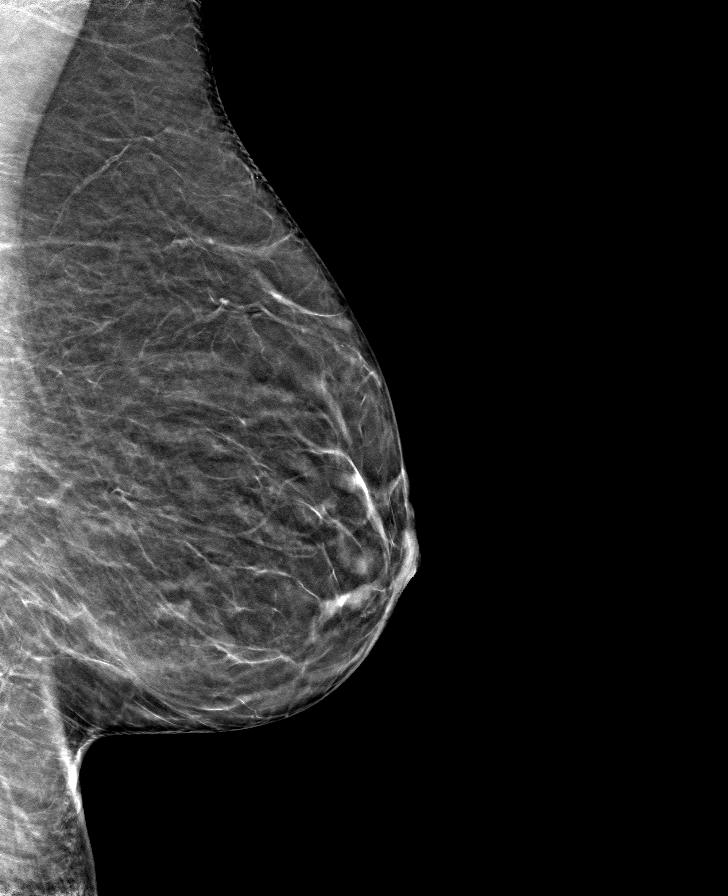

[R CC tomo · tomo slice 32/63.0]
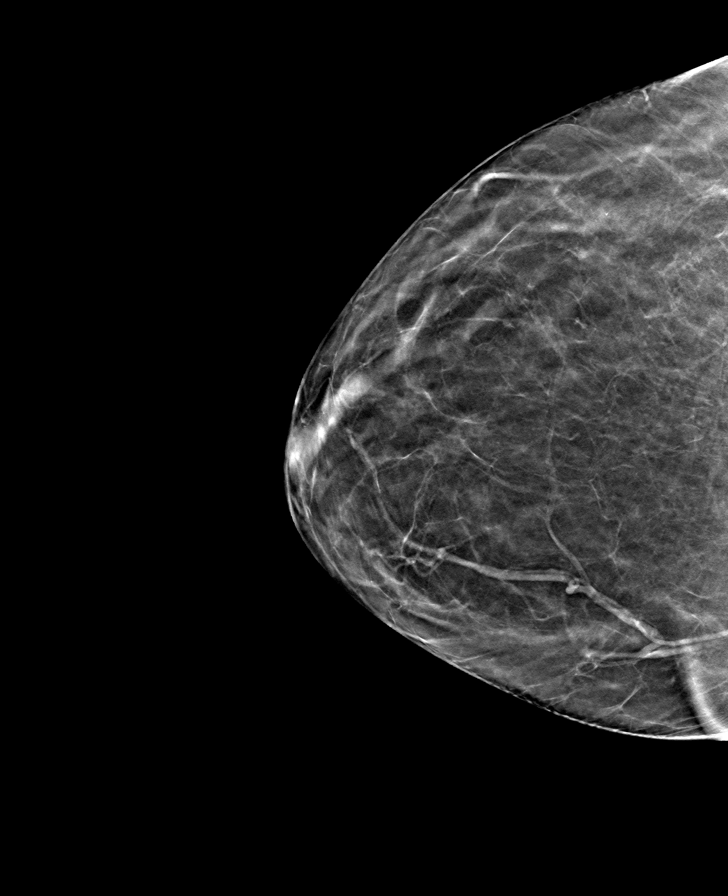

[L CC tomo · tomo slice 32/63.0]
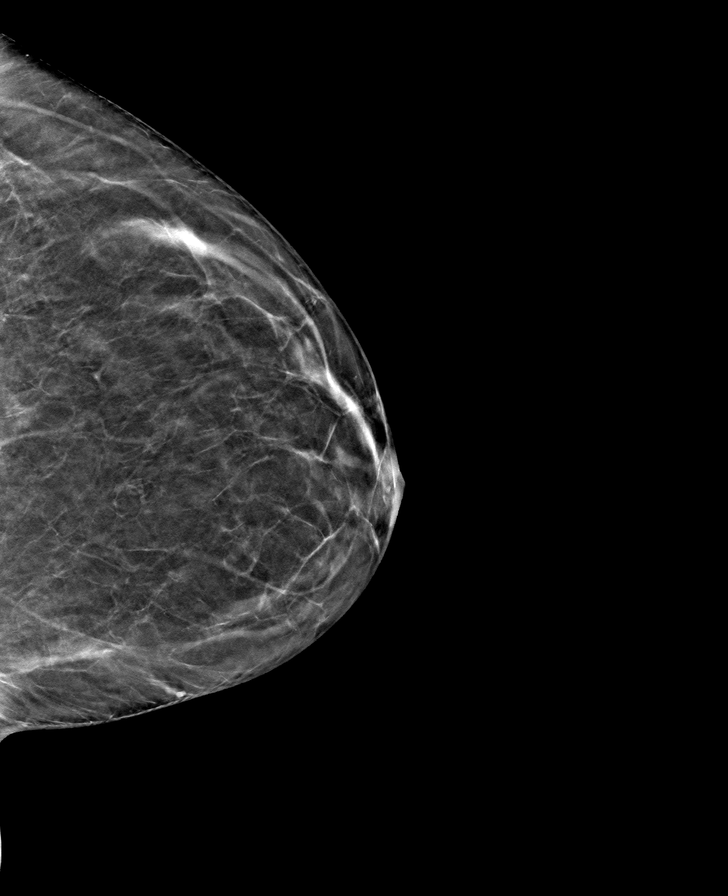

[8 of 24 positions shown; findings below may reference images not displayed]

ACR Breast Density Category b: There are scattered areas of
fibroglandular density.
FINDINGS: There are no findings suspicious for malignancy. Images were
processed with CAD.
IMPRESSION: No mammographic evidence of malignancy. A result letter of this
screening mammogram will be mailed directly to the patient.

RECOMMENDATION:
Screening mammogram in one year. (Code:[id number])

BI-RADS CATEGORY  1: Negative.

## 2020-04-24 DIAGNOSIS — G43909 Migraine, unspecified, not intractable, without status migrainosus: Secondary | ICD-10-CM | POA: Insufficient documentation

## 2020-06-01 DIAGNOSIS — Z471 Aftercare following joint replacement surgery: Secondary | ICD-10-CM | POA: Insufficient documentation

## 2020-06-01 DIAGNOSIS — Z96651 Presence of right artificial knee joint: Secondary | ICD-10-CM | POA: Insufficient documentation

## 2021-10-01 DIAGNOSIS — R232 Flushing: Secondary | ICD-10-CM | POA: Insufficient documentation

## 2021-10-01 DIAGNOSIS — F419 Anxiety disorder, unspecified: Secondary | ICD-10-CM | POA: Insufficient documentation

## 2021-10-01 DIAGNOSIS — M797 Fibromyalgia: Secondary | ICD-10-CM | POA: Insufficient documentation

## 2021-10-04 ENCOUNTER — Other Ambulatory Visit: Payer: Self-pay | Admitting: Student

## 2021-10-04 DIAGNOSIS — Z1231 Encounter for screening mammogram for malignant neoplasm of breast: Secondary | ICD-10-CM

## 2021-10-29 DIAGNOSIS — Z7689 Persons encountering health services in other specified circumstances: Secondary | ICD-10-CM | POA: Insufficient documentation

## 2022-11-08 ENCOUNTER — Emergency Department: Admission: EM | Admit: 2022-11-08 | Discharge: 2022-11-08 | Discharge status: Against Medical Advice | Payer: 59

## 2022-11-08 NOTE — ED Notes (Signed)
No answer when called several times from lobby 

## 2023-01-03 NOTE — Progress Notes (Unsigned)
New patient visit   Patient: Patty Kennedy   DOB: 08/03/1971   52 y.o. Female  MRN: [id number] Visit Date: 01/04/2023  Today's healthcare provider: Gwyneth Sprout, FNP   No chief complaint on file.  Subjective    Patty Kennedy is a 52 y.o. female who presents today as a new patient to establish care.  HPI  ***  Past Medical History:  Diagnosis Date   Arthritis    Depression    Fibromyalgia    Past Surgical History:  Procedure Laterality Date   KNEE SURGERY Bilateral 1999, 2000   arthroscopy   TUBAL LIGATION  2006   TUBAL LIGATION     Family Status  Relation Name Status   Mother  Deceased   Father  Deceased   Sister  Alive   Daughter  Alive   Son  Alive   Son  Alive   PGM  (Not Specified)   Neg Hx  (Not Specified)   Family History  Problem Relation Age of Onset   Arthritis Mother        rheumatoid   Depression Mother    Cancer Father 13       bone cancer   Hypertension Sister    Alcohol abuse Sister    Arthritis Paternal Grandmother    Breast cancer Neg Hx    Social History   Socioeconomic History   Marital status: Unknown    Spouse name: Shanon Brow   Number of children: 3   Years of education: 12   Highest education level: Not on file  Occupational History   Occupation: Financial trader: [employer]    Comment: [place]  Tobacco Use   Smoking status: Never   Smokeless tobacco: Never  Substance and Sexual Activity   Alcohol use: No    Comment: Rare 1 every 6 month   Drug use: No   Sexual activity: Not on file  Other Topics Concern   Not on file  Social History Narrative   ** Merged History Encounter **       Patty Kennedy grew up in [place]. She lives at home with her husband, Shanon Brow, in [place]. She is an Sales promotion account executive for [employer]. She enjoys people. Ajane enjoys gardening and the outdoors. She also enjoys swimming. She also enjoys reading.   Social Determinants of Health   Financial Resource Strain: Not on file   Food Insecurity: Not on file  Transportation Needs: Not on file  Physical Activity: Not on file  Stress: Not on file  Social Connections: Not on file   Outpatient Medications Prior to Visit  Medication Sig   amitriptyline (ELAVIL) 25 MG tablet Take by mouth.   fexofenadine (ALLEGRA) 180 MG tablet Take by mouth.   hydrochlorothiazide (HYDRODIURIL) 12.5 MG tablet Take by mouth.   ibuprofen (ADVIL,MOTRIN) 800 MG tablet Take 800 mg by mouth daily.    Melatonin 5 MG TABS Take 5 mg by mouth daily.   meloxicam (MOBIC) 15 MG tablet Take 15 mg by mouth daily.   No facility-administered medications prior to visit.   No Known Allergies  Immunization History  Administered Date(s) Administered   Moderna Sars-Covid-2 Vaccination 01/29/2020, 03/01/2020    Health Maintenance  Topic Date Due   HIV Screening  Never done   Hepatitis C Screening  Never done   DTaP/Tdap/Td (1 - Tdap) Never done   PAP SMEAR-Modifier  Never done   COLONOSCOPY (Pts 45-23yr Insurance coverage will need to  be confirmed)  Never done   MAMMOGRAM  09/05/2020   Zoster Vaccines- Shingrix (2 of 2) 11/08/2021   INFLUENZA VACCINE  06/14/2022   COVID-19 Vaccine (3 - 2023-24 season) 07/15/2022   HPV VACCINES  Aged Out    Patient Care Team: White, Orlene Och, NP as PCP - General (Family Medicine) Rey, Latina Craver, NP (Nurse Practitioner)  Review of Systems  {Labs  Heme  Chem  Endocrine  Serology  Results Review (optional):23779}   Objective    There were no vitals taken for this visit. {Show previous vital signs (optional):23777}  Physical Exam ***  Depression Screen    06/27/2018   10:08 AM 06/05/2018    9:38 AM 05/03/2018   12:11 PM 03/21/2018   12:27 PM  PHQ 2/9 Scores  PHQ - 2 Score 0 0 0 0   No results found for any visits on 01/04/23.  Assessment & Plan     ***  No follow-ups on file.     {provider attestation***:1}   Gwyneth Sprout, [place] [phone] (phone) [phone] (fax)  [place]

## 2023-01-04 ENCOUNTER — Ambulatory Visit: Payer: 59 | Admitting: Family Medicine

## 2023-01-04 ENCOUNTER — Encounter: Payer: Self-pay | Admitting: Family Medicine

## 2023-01-04 VITALS — BP 123/86 | HR 72 | Temp 99.0°F | Ht 65.0 in | Wt 234.2 lb

## 2023-01-04 DIAGNOSIS — Z13220 Encounter for screening for lipoid disorders: Secondary | ICD-10-CM | POA: Insufficient documentation

## 2023-01-04 DIAGNOSIS — Z136 Encounter for screening for cardiovascular disorders: Secondary | ICD-10-CM

## 2023-01-04 DIAGNOSIS — F332 Major depressive disorder, recurrent severe without psychotic features: Secondary | ICD-10-CM

## 2023-01-04 DIAGNOSIS — Z131 Encounter for screening for diabetes mellitus: Secondary | ICD-10-CM

## 2023-01-04 DIAGNOSIS — Z1211 Encounter for screening for malignant neoplasm of colon: Secondary | ICD-10-CM

## 2023-01-04 DIAGNOSIS — Z6838 Body mass index (BMI) 38.0-38.9, adult: Secondary | ICD-10-CM

## 2023-01-04 DIAGNOSIS — Z1159 Encounter for screening for other viral diseases: Secondary | ICD-10-CM

## 2023-01-04 DIAGNOSIS — R41840 Attention and concentration deficit: Secondary | ICD-10-CM | POA: Insufficient documentation

## 2023-01-04 DIAGNOSIS — Z7689 Persons encountering health services in other specified circumstances: Secondary | ICD-10-CM

## 2023-01-04 DIAGNOSIS — Z1231 Encounter for screening mammogram for malignant neoplasm of breast: Secondary | ICD-10-CM

## 2023-01-04 DIAGNOSIS — G2581 Restless legs syndrome: Secondary | ICD-10-CM

## 2023-01-04 DIAGNOSIS — Z114 Encounter for screening for human immunodeficiency virus [HIV]: Secondary | ICD-10-CM

## 2023-01-04 DIAGNOSIS — I10 Essential (primary) hypertension: Secondary | ICD-10-CM

## 2023-01-04 DIAGNOSIS — Z1329 Encounter for screening for other suspected endocrine disorder: Secondary | ICD-10-CM | POA: Insufficient documentation

## 2023-01-04 DIAGNOSIS — N951 Menopausal and female climacteric states: Secondary | ICD-10-CM | POA: Insufficient documentation

## 2023-01-04 DIAGNOSIS — M797 Fibromyalgia: Secondary | ICD-10-CM

## 2023-01-04 DIAGNOSIS — F411 Generalized anxiety disorder: Secondary | ICD-10-CM | POA: Insufficient documentation

## 2023-01-04 MED ORDER — BUSPIRONE HCL 5 MG PO TABS: 5.0000 mg | ORAL_TABLET | Freq: Three times a day (TID) | ORAL | 1 refills | Status: DC

## 2023-01-04 MED ORDER — ESTRADIOL 0.1 MG/GM VA CREA: 1.0000 | TOPICAL_CREAM | VAGINAL | 12 refills | Status: DC

## 2023-01-04 MED ORDER — DULOXETINE HCL 60 MG PO CPEP: 60.0000 mg | ORAL_CAPSULE | Freq: Two times a day (BID) | ORAL | 1 refills | Status: DC

## 2023-01-04 MED ORDER — SEMAGLUTIDE-WEIGHT MANAGEMENT 0.25 MG/0.5ML ~~LOC~~ SOAJ: 0.2500 mg | SUBCUTANEOUS | 0 refills | Status: DC

## 2023-01-04 NOTE — Assessment & Plan Note (Signed)
Agreeable to colo guard  °

## 2023-01-04 NOTE — Assessment & Plan Note (Signed)
Recommend A1c screening given hx of obesity Continue to recommend balanced, lower carb meals. Smaller meal size, adding snacks. Choosing water as drink of choice and increasing purposeful exercise.

## 2023-01-04 NOTE — Assessment & Plan Note (Signed)
Chronic, variable Trial of buspar 5 mg TID to assist 1 month RTC

## 2023-01-04 NOTE — Assessment & Plan Note (Signed)
Without menses for >2 years; endorses low libido and complaints of vaginal dryness. Trial of topical estrogen to assist

## 2023-01-04 NOTE — Assessment & Plan Note (Signed)
Low risk screen ?Consented; encouraged to "know your status" ?Recommend repeat screen if risk factors change ? ?

## 2023-01-04 NOTE — Assessment & Plan Note (Signed)
Chronic, well controlled with diet/exercise/supplements Hold refill of HCTZ at this time Continue to monitor; goal of <140/<90

## 2023-01-04 NOTE — Assessment & Plan Note (Signed)
Wishes to find a LIP closer to home

## 2023-01-04 NOTE — Assessment & Plan Note (Signed)
Due for screening for mammogram, denies breast concerns, provided with phone number to call and schedule appointment for mammogram. Encouraged to repeat breast cancer screening every 1-2 years. Please call and schedule your mammogram:  [facility]  [place], [place],  [place]  [address] Get Driving Directions Main: [phone]  Sunday:Closed Monday:7:20 AM - 5:00 PM Tuesday:7:20 AM - 5:00 PM Wednesday:7:20 AM - 5:00 PM Thursday:7:20 AM - 5:00 PM Friday:7:20 AM - 4:30 PM Saturday:Closed

## 2023-01-04 NOTE — Assessment & Plan Note (Signed)
Chronic, worsening Body mass index is 38.97 kg/m. Wishes to restart injectables to assist

## 2023-01-04 NOTE — Assessment & Plan Note (Signed)
Chronic, worsening Recommend increase of cymablta to assist to 60 BID

## 2023-01-04 NOTE — Assessment & Plan Note (Signed)
Recommend initial thyroid labs given htn and obesity

## 2023-01-04 NOTE — Assessment & Plan Note (Signed)
Chronic, variable Affects sleep 4x/week Remains on elavil qHS 50 mg

## 2023-01-04 NOTE — Assessment & Plan Note (Signed)
Body mass index is 38.97 kg/m. Associated with HTN, Depression

## 2023-01-04 NOTE — Assessment & Plan Note (Signed)
Major, recurrent depression Hx of SSRI use; now on SNRI d/t chronic pain iso fibromyalgia Pt endorses lots of stressors, new job, family, recent loss of insurance, etc Recommend increase to BID dosing

## 2023-01-04 NOTE — Assessment & Plan Note (Signed)
Unclear if this anxiety, depression or situational Referral to neuro psych for AD(H)D screening

## 2023-01-04 NOTE — Assessment & Plan Note (Signed)
LDL goal of <100 Check NFLP given HTN and Obesity hx recommend diet low in saturated fat and regular exercise - 30 min at least 5 times per week

## 2023-01-04 NOTE — Assessment & Plan Note (Signed)
Low risk screen Treatable, and curable. If left untreated Hep C can lead to cirrhosis and liver failure. Encourage routine testing; recommend repeat testing if risk factors change.  

## 2023-01-05 NOTE — Progress Notes (Signed)
Hi Patty Kennedy,  It was nice to meet you yesterday.   Blood chemistry is normal and stable.  Cholesterol shows slightly elevated LDL. I continue to recommend diet low in saturated fat and regular exercise - 30 min at least 5 times per week; however, Stroke and/or MI risk remains low.  The 10-year ASCVD risk score (Arnett DK, et al., 2019) is: 1.2%   Values used to calculate the score:     Age: 52 years     Sex: Female     Is Non-Hispanic African American: No     Diabetic: No     Tobacco smoker: No     Systolic Blood Pressure: [id number] mmHg     Is BP treated: No     HDL Cholesterol: 64 mg/dL     Total Cholesterol: 189 mg/dL  Non-reactive infectious disease panels.  Inflammatory markers show -normal/negative Sed Rate -normal/negative CRP -normal/negative Rheumatoid factor -CCPA is pending  Normal thyroid  No concern for pre-diabetes/T2 DM with normal A1c. Continue to recommend balanced, lower carb meals. Smaller meal size, adding snacks. Choosing water as drink of choice and increasing purposeful exercise.  Please call and complete your mammogram and your cologuard.  You will receive a call from neuro psych for Adult AD(H)D testing.  Please let us know if you have any questions.  Thank you, Gwyneth Sprout, [address] [place], [place] [address] [phone] (phone) [phone] (fax) [place]

## 2023-01-06 LAB — CBC WITH DIFFERENTIAL/PLATELET
Basophils Absolute: 0 10*3/uL (ref 0.0–0.2)
Basos: 1 %
EOS (ABSOLUTE): 0.2 10*3/uL (ref 0.0–0.4)
Eos: 3 %
Hematocrit: 38.4 % (ref 34.0–46.6)
Hemoglobin: 12.5 g/dL (ref 11.1–15.9)
Immature Grans (Abs): 0 10*3/uL (ref 0.0–0.1)
Immature Granulocytes: 0 %
Lymphocytes Absolute: 1.8 10*3/uL (ref 0.7–3.1)
Lymphs: 32 %
MCH: 29.1 pg (ref 26.6–33.0)
MCHC: 32.6 g/dL (ref 31.5–35.7)
MCV: 89 fL (ref 79–97)
Monocytes Absolute: 0.5 10*3/uL (ref 0.1–0.9)
Monocytes: 8 %
Neutrophils Absolute: 3.2 10*3/uL (ref 1.4–7.0)
Neutrophils: 56 %
Platelets: 326 10*3/uL (ref 150–450)
RBC: 4.3 x10E6/uL (ref 3.77–5.28)
RDW: 13.3 % (ref 11.7–15.4)
WBC: 5.7 10*3/uL (ref 3.4–10.8)

## 2023-01-06 LAB — COMPREHENSIVE METABOLIC PANEL
ALT: 18 IU/L (ref 0–32)
AST: 21 IU/L (ref 0–40)
Albumin/Globulin Ratio: 1.5 (ref 1.2–2.2)
Albumin: 4.3 g/dL (ref 3.8–4.9)
Alkaline Phosphatase: 76 IU/L (ref 44–121)
BUN/Creatinine Ratio: 18 (ref 9–23)
BUN: 17 mg/dL (ref 6–24)
Bilirubin Total: 0.3 mg/dL (ref 0.0–1.2)
CO2: 24 mmol/L (ref 20–29)
Calcium: 9.8 mg/dL (ref 8.7–10.2)
Chloride: 103 mmol/L (ref 96–106)
Creatinine, Ser: 0.93 mg/dL (ref 0.57–1.00)
Globulin, Total: 2.9 g/dL (ref 1.5–4.5)
Glucose: 102 mg/dL — ABNORMAL HIGH (ref 70–99)
Potassium: 4.3 mmol/L (ref 3.5–5.2)
Sodium: 141 mmol/L (ref 134–144)
Total Protein: 7.2 g/dL (ref 6.0–8.5)
eGFR: 73 mL/min/{1.73_m2} (ref >59)

## 2023-01-06 LAB — TSH+FREE T4
Free T4: 1.17 ng/dL (ref 0.82–1.77)
TSH: 1.01 u[IU]/mL (ref 0.450–4.500)

## 2023-01-06 LAB — CYCLIC CITRUL PEPTIDE ANTIBODY, IGG/IGA: Cyclic Citrullin Peptide Ab: 8 units (ref 0–19)

## 2023-01-06 LAB — HIV ANTIBODY (ROUTINE TESTING W REFLEX): HIV Screen 4th Generation wRfx: NONREACTIVE

## 2023-01-06 LAB — HEMOGLOBIN A1C
Est. average glucose Bld gHb Est-mCnc: 100 mg/dL
Hgb A1c MFr Bld: 5.1 % (ref 4.8–5.6)

## 2023-01-06 LAB — HEPATITIS C ANTIBODY: Hep C Virus Ab: NONREACTIVE

## 2023-01-06 LAB — LIPID PANEL
Chol/HDL Ratio: 3 ratio (ref 0.0–4.4)
Cholesterol, Total: 189 mg/dL (ref 100–199)
HDL: 64 mg/dL (ref >39)
LDL Chol Calc (NIH): 112 mg/dL — ABNORMAL HIGH (ref 0–99)
Triglycerides: 68 mg/dL (ref 0–149)
VLDL Cholesterol Cal: 13 mg/dL (ref 5–40)

## 2023-01-06 LAB — RHEUMATOID FACTOR: Rheumatoid fact SerPl-aCnc: 10.3 IU/mL (ref <14.0)

## 2023-01-06 LAB — C-REACTIVE PROTEIN: CRP: 4 mg/L (ref 0–10)

## 2023-01-06 LAB — SEDIMENTATION RATE: Sed Rate: 24 mm/hr (ref 0–40)

## 2023-02-01 ENCOUNTER — Ambulatory Visit: Payer: 59 | Admitting: Family Medicine

## 2023-02-02 NOTE — Progress Notes (Deleted)
      Established patient visit   Patient: Patty Kennedy   DOB: Apr 05, 1971   52 y.o. Female  MRN: [id number] Visit Date: 02/03/2023  Today's healthcare provider: Gwyneth Sprout, FNP   No chief complaint on file.  Subjective    HPI  Depression, Follow-up  She  was last seen for this 1 months ago. Changes made at last visit include DULoxetine (Cymbalta) 60 mg capsule, and Semaglutide-Weight Management 0.25 MG/0.5ML SOAJ.   She reports {excellent/good/fair/poor:19665} compliance with treatment. She {is/is not:21021397} having side effects. ***  She reports {DESC; GOOD/FAIR/POOR:18685} tolerance of treatment. Current symptoms include: {Symptoms; depression:1002} She feels she is {improved/worse/unchanged:3041574} since last visit.     -----------------------------------------------------------------------------------------  Anxiety, Follow-up  She was last seen for anxiety 4 weeks ago. Changes made at last visit include trial of Buspar 5mg  TID.   She reports {excellent/good/fair/poor:19665} compliance with treatment. She reports {good/fair/poor:18685} tolerance of treatment. She {is/is not:21021397} having side effects. {document side effects if present:1}  She feels her anxiety is {Desc; severity:60313} and {improved/worse/unchanged:3041574} since last visit.  Symptoms: {Yes/No:20286} chest pain {Yes/No:20286} difficulty concentrating  {Yes/No:20286} dizziness {Yes/No:20286} fatigue  {Yes/No:20286} feelings of losing control {Yes/No:20286} insomnia  {Yes/No:20286} irritable {Yes/No:20286} palpitations  {Yes/No:20286} panic attacks {Yes/No:20286} racing thoughts  {Yes/No:20286} shortness of breath {Yes/No:20286} sweating  {Yes/No:20286} tremors/shakes    GAD-7 Results     No data to display          PHQ-9 Scores    01/04/2023    2:26 PM 06/27/2018   10:08 AM 06/05/2018    9:38 AM  PHQ9 SCORE ONLY  PHQ-9 Total Score 14 0 0     ---------------------------------------------------------------------------------------------------  Medications: Outpatient Medications Prior to Visit  Medication Sig   amitriptyline (ELAVIL) 50 MG tablet Take 50 mg by mouth at bedtime.   busPIRone (BUSPAR) 5 MG tablet Take 1 tablet (5 mg total) by mouth 3 (three) times daily.   DULoxetine (CYMBALTA) 60 MG capsule Take 1 capsule (60 mg total) by mouth 2 (two) times daily.   estradiol (ESTRACE VAGINAL) 0.1 MG/GM vaginal cream Place 1 Applicatorful vaginally 3 (three) times a week.   ibuprofen (ADVIL,MOTRIN) 800 MG tablet Take 800 mg by mouth daily.    meloxicam (MOBIC) 15 MG tablet Take 1 tablet by mouth daily.   Semaglutide-Weight Management 0.25 MG/0.5ML SOAJ Inject 0.25 mg into the skin once a week.   No facility-administered medications prior to visit.    Review of Systems  {Labs  Heme  Chem  Endocrine  Serology  Results Review (optional):23779}   Objective    There were no vitals taken for this visit. {Show previous vital signs (optional):23777}  Physical Exam  ***  No results found for any visits on 02/03/23.  Assessment & Plan     ***  No follow-ups on file.      {provider attestation***:1}   Gwyneth Sprout, [place] [phone] (phone) [phone] (fax)  [place]

## 2023-02-03 ENCOUNTER — Ambulatory Visit: Payer: 59 | Admitting: Family Medicine

## 2023-09-07 ENCOUNTER — Other Ambulatory Visit: Payer: Self-pay | Admitting: Family Medicine

## 2023-09-07 DIAGNOSIS — M797 Fibromyalgia: Secondary | ICD-10-CM

## 2024-04-05 ENCOUNTER — Ambulatory Visit: Admitting: Family Medicine

## 2024-04-05 ENCOUNTER — Ambulatory Visit: Payer: Self-pay

## 2024-04-05 NOTE — Telephone Encounter (Signed)
 1st attempt to call patient back (no answer and left voicemail) after call was disconnected before PAS could warm transfer patient.  Copied from CRM [id number]. Topic: Clinical - Red Word Triage >> Apr 05, 2024  1:39 PM Patty Kennedy wrote: [facility] that prompted transfer to Nurse Triage: Right shoulder pain

## 2024-04-05 NOTE — Telephone Encounter (Signed)
 Reason for Disposition . [1] MODERATE pain (e.g., interferes with normal activities) AND [2] present > 3 days  Answer Assessment - Initial Assessment Questions 1. ONSET: "When did the pain start?"     A month ago 2. LOCATION: "Where is the pain located?"     Right shoulder  3. PAIN: "How bad is the pain?" (Scale 1-10; or mild, moderate, severe)   - MILD (1-3): doesn't interfere with normal activities   - MODERATE (4-7): interferes with normal activities (e.g., work or school) or awakens from sleep   - SEVERE (8-10): excruciating pain, unable to do any normal activities, unable to move arm at all due to pain     Moderate; wakes her up at night 4. WORK OR EXERCISE: "Has there been any recent work or exercise that involved this part of the body?"     No  5. CAUSE: "What do you think is causing the shoulder pain?"     Rotator cuff 6. OTHER SYMPTOMS: "Do you have any other symptoms?" (e.g., neck pain, swelling, rash, fever, numbness, weakness)     no 7. PREGNANCY: "Is there any chance you are pregnant?" "When was your last menstrual period?"     na  Protocols used: Shoulder Pain-A-AH

## 2024-04-12 ENCOUNTER — Ambulatory Visit: Admitting: Family Medicine

## 2024-11-08 ENCOUNTER — Encounter: Payer: Self-pay | Admitting: Family Medicine

## 2024-11-08 ENCOUNTER — Ambulatory Visit: Admitting: Family Medicine

## 2024-11-08 VITALS — BP 115/82 | HR 79 | Temp 98.1°F | Resp 16 | Ht 64.96 in | Wt 207.4 lb

## 2024-11-08 DIAGNOSIS — F5101 Primary insomnia: Secondary | ICD-10-CM

## 2024-11-08 DIAGNOSIS — M797 Fibromyalgia: Secondary | ICD-10-CM

## 2024-11-08 MED ORDER — ZOLPIDEM TARTRATE 5 MG PO TABS: 5.0000 mg | ORAL_TABLET | Freq: Every evening | ORAL | 1 refills | Status: DC | PRN

## 2024-11-08 MED ORDER — AMITRIPTYLINE HCL 50 MG PO TABS: 50.0000 mg | ORAL_TABLET | Freq: Every day | ORAL | 1 refills | Status: AC

## 2024-11-08 MED ORDER — DULOXETINE HCL 60 MG PO CPEP: 60.0000 mg | ORAL_CAPSULE | Freq: Two times a day (BID) | ORAL | 1 refills | Status: AC

## 2024-11-08 MED ORDER — MELOXICAM 15 MG PO TABS: 15.0000 mg | ORAL_TABLET | Freq: Every day | ORAL | 1 refills | Status: AC

## 2024-11-08 NOTE — Progress Notes (Signed)
 "  New Patient Office Visit  Subjective    Patient ID: Patty Kennedy, female    DOB: Apr 02, 1971  Age: 53 y.o. MRN: [id number]  CC:  Chief Complaint  Patient presents with   Establish Care    Here to establish care.   Fibromyalgia    Was dx in 2006 with fibromyalgia. Has always had migraines with it but the last few months, she has had cluster migraines. Not sure it new. Has had to miss work 3 days in the last couple months. Has also had trouble sleeping. Cannot relax but not fall asleep. IF she does fall asleep, she wakes up. Is not stressed but physically body does not settle.    HPI Patty Kennedy presents to establish care Discussed the use of AI scribe software for clinical note transcription with the patient, who gave verbal consent to proceed.  History of Present Illness   Patty Kennedy is a 53 year old female with fibromyalgia who presents with worsening cluster headaches and sleep disturbances.  She has been experiencing worsening cluster headaches over the past few months, which have significantly impacted her sleep. She has difficulty falling asleep and wakes up after 2-3 hours, unable to return to sleep. She has not used any medications for sleep.  Her current medications for fibromyalgia include Duloxetine , amitriptyline  50 mg, and meloxicam . She uses Tylenol  for additional pain relief as needed.  She has experienced significant weight loss, reducing her weight from 248 pounds to 199 pounds, with a goal of reaching 180 pounds. She attributes some improvement in her knee pain to this weight loss. She is currently using semaglutide , which she obtained online.  Her semaglutide  is compounded with vitamin B12.  This is odd as B12 is an IM injection and Semaglutide  is a subcutaneous injection.    She has a history of right knee joint replacement and tubal ligation. She has previously used vaginal estrogen cream but discontinued it due to insurance issues and switched to patches, which  she found effective for hot flashes. However, she has not used them for several months.  She is unsure if she snores, as her husband says she does, but she denies waking up gasping or having breathing problems during sleep. She does not consume alcohol or use recreational drugs. 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 She lives with her son, daughter-in-law, and grandson.       Outpatient Encounter Medications as of 11/08/2024  Medication Sig   SEMAGLUTIDE  [place] Inject into the skin once a week. WITH CYANOCOBALAMIN  5/0.5mg  injection (empower)   zolpidem  (AMBIEN ) 5 MG tablet Take 1 tablet (5 mg total) by mouth at bedtime as needed for sleep.   [DISCONTINUED] amitriptyline  (ELAVIL ) 50 MG tablet Take 50 mg by mouth at bedtime.   [DISCONTINUED] DULoxetine  (CYMBALTA ) 60 MG capsule Take 1 capsule (60 mg total) by mouth 2 (two) times daily.   [DISCONTINUED] meloxicam  (MOBIC ) 15 MG tablet Take 15 mg by mouth daily.   amitriptyline  (ELAVIL ) 50 MG tablet Take 1 tablet (50 mg total) by mouth at bedtime.   DULoxetine  (CYMBALTA ) 60 MG capsule Take 1 capsule (60 mg total) by mouth 2 (two) times daily.   meloxicam  (MOBIC ) 15 MG tablet Take 1 tablet (15 mg total) by mouth daily.   [DISCONTINUED] busPIRone  (BUSPAR ) 5 MG tablet Take 1 tablet (5 mg total) by mouth 3 (three) times daily.   [DISCONTINUED] estradiol  (ESTRACE  VAGINAL) 0.1 MG/GM vaginal cream Place  1 Applicatorful vaginally 3 (three) times a week.   [DISCONTINUED] ibuprofen  (ADVIL ,MOTRIN ) 800 MG tablet Take 800 mg by mouth daily.    [DISCONTINUED] Semaglutide -Weight Management 0.25 MG/0.5ML SOAJ Inject 0.25 mg into the skin once a week.   No facility-administered encounter medications on file as of 11/08/2024.    Past Medical History:  Diagnosis Date   Allergy    Anxiety    Arthritis    Depression    Fibromyalgia    Hypertension     Past Surgical History:  Procedure Laterality Date   JOINT  REPLACEMENT     KNEE SURGERY Bilateral 1999, 2000   arthroscopy   TUBAL LIGATION  11/14/2004    Family History  Problem Relation Age of Onset   Arthritis Mother        rheumatoid   Depression Mother    Cancer Father 7       bone cancer   Hypertension Sister    Alcohol abuse Sister    Arthritis Paternal Grandmother    Breast cancer Neg Hx     Social History   Socioeconomic History   Marital status: Married    Spouse name: Patty Kennedy   Number of children: 3   Years of education: 12   Highest education level: Not on file  Occupational History   Occupation: Barrister's Clerk: [employer]    Comment: [employer]  Tobacco Use   Smoking status: Never    Passive exposure: Never   Smokeless tobacco: Never  Vaping Use   Vaping status: Never Used  Substance and Sexual Activity   Alcohol use: Yes    Comment: less than once every couple weeks   Drug use: No   Sexual activity: Yes    Birth control/protection: Surgical  Other Topics Concern   Not on file  Social History Narrative   ** Merged History Encounter **       Patty Kennedy grew up in [place] . She lives at home with her husband, Patty Kennedy, in [place]. 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 She enjoys people. Patty Kennedy enjoys gardening and the outdoors. She also enjoys swimming. She also enjoys reading.   Social Drivers of Health   Tobacco Use: Low Risk (11/08/2024)   Patient History    Smoking Tobacco Use: Never    Smokeless Tobacco Use: Never    Passive Exposure: Never  Financial Resource Strain: Medium Risk (12/02/2021)   Received from [facility]   Overall Financial Resource Strain (CARDIA)    Difficulty of Paying Living Expenses: Somewhat hard  Food Insecurity: No Food Insecurity (11/08/2024)   Epic    Worried About Radiation Protection Practitioner of Food in the Last Year: Never true    Ran Out of Food in the Last Year: Never true  Transportation Needs: No Transportation Needs (11/08/2024)   Epic    Lack of  Transportation (Medical): No    Lack of Transportation (Non-Medical): No  Physical Activity: Not on file  Stress: Not on file  Social Connections: Not on file  Intimate Partner Violence: Not At Risk (11/08/2024)   Epic    Fear of Current or Ex-Partner: No    Emotionally Abused: No    Physically Abused: No    Sexually Abused: No  Depression (PHQ2-9): Medium Risk (11/08/2024)   Depression (PHQ2-9)    PHQ-2 Score: 5  Alcohol Screen: Not on file  Housing: Unknown (11/08/2024)   Epic    Unable to Pay for Housing in the  Last Year: No    Number of Times Moved in the Last Year: Not on file    Homeless in the Last Year: No  Utilities: Not At Risk (11/08/2024)   Epic    Threatened with loss of utilities: No  Health Literacy: Not on file    ROS      Objective   BP 115/82   Pulse 79   Temp 98.1 F (36.7 C) (Oral)   Resp 16   Ht 5' 4.96 (1.65 m)   Wt 207 lb 6.4 oz (94.1 kg)   LMP  (Approximate)   SpO2 96%   BMI 34.55 kg/m    Physical Exam Vitals and nursing note reviewed.  Constitutional:      Appearance: Normal appearance.  HENT:     Head: Normocephalic and atraumatic.  Eyes:     Conjunctiva/sclera: Conjunctivae normal.  Cardiovascular:     Rate and Rhythm: Normal rate and regular rhythm.  Pulmonary:     Effort: Pulmonary effort is normal.     Breath sounds: Normal breath sounds.  Musculoskeletal:     Right lower leg: No edema.     Left lower leg: No edema.  Skin:    General: Skin is warm and dry.  Neurological:     Mental Status: She is alert and oriented to person, place, and time.  Psychiatric:        Mood and Affect: Mood normal.        Behavior: Behavior normal.        Thought Content: Thought content normal.        Judgment: Judgment normal.            The 10-year ASCVD risk score (Arnett DK, et al., 2019) is: 1.3%     Assessment & Plan:  Primary insomnia Assessment & Plan: She has dIfficulty going to sleep and then once she gets asleep  she only sleeps a few hours and then she is awake.  Usually she cannot go back to sleep after she wakes.  Discussed options of hydroxyzine, Ambien  and trazodone.  She would like to try Ambien .  Advised we will start with the 5 mg and see how she does without.  Follow-up in a month.  Orders: -     Zolpidem  Tartrate; Take 1 tablet (5 mg total) by mouth at bedtime as needed for sleep.  Dispense: 30 tablet; Refill: 1  Fibromyalgia -     DULoxetine  HCl; Take 1 capsule (60 mg total) by mouth 2 (two) times daily.  Dispense: 180 capsule; Refill: 1 -     Amitriptyline  HCl; Take 1 tablet (50 mg total) by mouth at bedtime.  Dispense: 90 tablet; Refill: 1 -     Meloxicam ; Take 1 tablet (15 mg total) by mouth daily.  Dispense: 90 tablet; Refill: 1    Return in about 4 weeks (around 12/06/2024).   Rondy Krupinski K Yarithza Mink, MD  "

## 2024-11-08 NOTE — Assessment & Plan Note (Signed)
 She has dIfficulty going to sleep and then once she gets asleep she only sleeps a few hours and then she is awake.  Usually she cannot go back to sleep after she wakes.  Discussed options of hydroxyzine, Ambien  and trazodone.  She would like to try Ambien .  Advised we will start with the 5 mg and see how she does without.  Follow-up in a month.

## 2024-12-07 ENCOUNTER — Other Ambulatory Visit: Payer: Self-pay | Admitting: Medical Genetics

## 2024-12-13 ENCOUNTER — Ambulatory Visit: Admitting: Family Medicine

## 2024-12-13 DIAGNOSIS — F5101 Primary insomnia: Secondary | ICD-10-CM | POA: Diagnosis not present

## 2024-12-13 DIAGNOSIS — M1711 Unilateral primary osteoarthritis, right knee: Secondary | ICD-10-CM

## 2024-12-13 MED ORDER — WEGOVY 1.5 MG PO TABS: 1.5000 mg | ORAL_TABLET | Freq: Every day | ORAL | 0 refills | Status: AC

## 2024-12-13 MED ORDER — ZOLPIDEM TARTRATE 5 MG PO TABS: 5.0000 mg | ORAL_TABLET | Freq: Every evening | ORAL | 5 refills | Status: AC | PRN

## 2024-12-13 NOTE — Progress Notes (Unsigned)
" ° °  Established Patient Office Visit  Subjective   Patient ID: Patty Kennedy, female    DOB: [birth year]  Age: 54 y.o. MRN: [id number]  Chief Complaint  Patient presents with   Follow-up    Follow-up Insomnia and Depression Pt stated--doing much better and only 2 headache.    HPI Discussed the use of AI scribe software for clinical note transcription with the patient, who gave verbal consent to proceed.  History of Present Illness   Patty Kennedy is a 54 year old female who presents for follow-up on sleep and weight management.  She has experienced significant improvement in sleep quality, with better sleep and fewer headaches, having only a couple this month. Improved sleep has reduced daytime sleepiness and eliminated afternoon energy crashes. She takes Ambien  around 8:00 to 8:15 PM and reports no snoring, nocturnal awakenings, or sleepwalking behaviors.  For weight management, she is focusing on increasing protein intake and uses an app to track her diet. She is currently on a weight plateau, fluctuating between 199 to 204 pounds, after losing weight from a previous 248 pounds. She engages in physical activity by doing exercise videos with her grandson and wants to incorporate low-weight exercises. She is on a semaglutide  compound at a dose of 2.5 mg for weight management.  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 She has a history of a previous knee replacement five years ago and experiences low blood pressure post-anesthesia, which is managed with fluids. Her mother had osteoarthritis but did not undergo knee replacement surgery.        Objective:     BP 104/68   Pulse 62   Ht 5' 5 (1.651 m)   Wt 204 lb (92.5 kg)   LMP 08/25/2018   SpO2 98%   BMI 33.95 kg/m  {Vitals History (Optional):23777}  Physical Exam Vitals and nursing note reviewed.   Constitutional:      Appearance: Normal appearance.  HENT:     Head: Normocephalic and atraumatic.  Eyes:     Conjunctiva/sclera: Conjunctivae normal.  Cardiovascular:     Rate and Rhythm: Normal rate and regular rhythm.  Pulmonary:     Effort: Pulmonary effort is normal.     Breath sounds: Normal breath sounds.  Musculoskeletal:     Right lower leg: No edema.     Left lower leg: No edema.  Skin:    General: Skin is warm and dry.  Neurological:     Mental Status: She is alert and oriented to person, place, and time.  Psychiatric:        Mood and Affect: Mood normal.        Behavior: Behavior normal.        Thought Content: Thought content normal.        Judgment: Judgment normal.      {Perform Simple Foot Exam  Perform Detailed exam:1} {Insert foot Exam (Optional):30965}   No results found for any visits on 12/13/24.  {Labs (Optional):23779}  The 10-year ASCVD risk score (Arnett DK, et al., 2019) is: 1.1%    Assessment & Plan:  Primary insomnia     Return in about 2 months (around 02/10/2025).    Gurnoor Ursua K Ahad Colarusso, MD "

## 2024-12-17 NOTE — Assessment & Plan Note (Signed)
 Significant improvement in sleep quality with Ambien  5 mg.  No snoring or nocturnal awakenings or daytime sleepiness.  Refilled her Ambien  for 4 months.

## 2024-12-17 NOTE — Assessment & Plan Note (Signed)
 Weight loss progress with a current weight of 199 down from 207 pounds.  Focusing on protein intake and exercise considering transition from semaglutide  to Wegovy  pill.  Advised most likely have to start at the lowest dose and work up.

## 2024-12-17 NOTE — Assessment & Plan Note (Signed)
 She is likely to have total knee replacement in the coming months.  Orthopedics are and has recommended spinal anesthesia instead of general anesthesia.  Have advised that if that is just as efficacious and her blood pressure will not be as affected.
